# Patient Record
Sex: Female | Born: 1935 | Race: Black or African American | Hispanic: No | Smoking: Never smoker
Health system: Southern US, Community
[De-identification: ages and names within clinical notes are randomized; demographics above are authoritative.]

## PROBLEM LIST (undated history)

## (undated) DIAGNOSIS — M722 Plantar fascial fibromatosis: Secondary | ICD-10-CM

## (undated) DIAGNOSIS — R413 Other amnesia: Secondary | ICD-10-CM

## (undated) DIAGNOSIS — N183 Chronic kidney disease, stage 3 unspecified: Secondary | ICD-10-CM

## (undated) DIAGNOSIS — Z9289 Personal history of other medical treatment: Secondary | ICD-10-CM

## (undated) DIAGNOSIS — I639 Cerebral infarction, unspecified: Secondary | ICD-10-CM

## (undated) DIAGNOSIS — E785 Hyperlipidemia, unspecified: Secondary | ICD-10-CM

## (undated) DIAGNOSIS — I10 Essential (primary) hypertension: Secondary | ICD-10-CM

## (undated) DIAGNOSIS — M1990 Unspecified osteoarthritis, unspecified site: Secondary | ICD-10-CM

## (undated) DIAGNOSIS — F329 Major depressive disorder, single episode, unspecified: Secondary | ICD-10-CM

## (undated) DIAGNOSIS — G459 Transient cerebral ischemic attack, unspecified: Secondary | ICD-10-CM

## (undated) DIAGNOSIS — G709 Myoneural disorder, unspecified: Secondary | ICD-10-CM

## (undated) DIAGNOSIS — R200 Anesthesia of skin: Secondary | ICD-10-CM

## (undated) DIAGNOSIS — I119 Hypertensive heart disease without heart failure: Secondary | ICD-10-CM

## (undated) DIAGNOSIS — K219 Gastro-esophageal reflux disease without esophagitis: Secondary | ICD-10-CM

## (undated) DIAGNOSIS — F32A Depression, unspecified: Secondary | ICD-10-CM

## (undated) DIAGNOSIS — F419 Anxiety disorder, unspecified: Secondary | ICD-10-CM

## (undated) HISTORY — PX: BILATERAL SALPINGOOPHORECTOMY: SHX1223

## (undated) HISTORY — DX: Chronic kidney disease, stage 3 (moderate): N18.3

## (undated) HISTORY — DX: Anesthesia of skin: R20.0

## (undated) HISTORY — DX: Plantar fascial fibromatosis: M72.2

## (undated) HISTORY — PX: TOTAL ABDOMINAL HYSTERECTOMY: SHX209

## (undated) HISTORY — DX: Chronic kidney disease, stage 3 unspecified: N18.30

## (undated) HISTORY — PX: OTHER SURGICAL HISTORY: SHX169

## (undated) HISTORY — DX: Hyperlipidemia, unspecified: E78.5

## (undated) HISTORY — DX: Transient cerebral ischemic attack, unspecified: G45.9

## (undated) HISTORY — DX: Essential (primary) hypertension: I10

---

## 2011-06-02 ENCOUNTER — Ambulatory Visit: Payer: Self-pay | Admitting: Physical Therapy

## 2011-06-03 ENCOUNTER — Ambulatory Visit: Payer: Medicare Other | Attending: Family Medicine | Admitting: Physical Therapy

## 2011-06-03 DIAGNOSIS — M25619 Stiffness of unspecified shoulder, not elsewhere classified: Secondary | ICD-10-CM | POA: Insufficient documentation

## 2011-06-03 DIAGNOSIS — M6281 Muscle weakness (generalized): Secondary | ICD-10-CM | POA: Insufficient documentation

## 2011-06-03 DIAGNOSIS — IMO0001 Reserved for inherently not codable concepts without codable children: Secondary | ICD-10-CM | POA: Insufficient documentation

## 2011-06-03 DIAGNOSIS — M25519 Pain in unspecified shoulder: Secondary | ICD-10-CM | POA: Insufficient documentation

## 2011-06-06 ENCOUNTER — Ambulatory Visit: Payer: Self-pay | Admitting: Physical Therapy

## 2011-06-11 ENCOUNTER — Ambulatory Visit: Payer: Medicare Other | Admitting: Physical Therapy

## 2011-06-18 ENCOUNTER — Ambulatory Visit: Payer: Medicare Other | Attending: Family Medicine

## 2011-06-18 DIAGNOSIS — M25519 Pain in unspecified shoulder: Secondary | ICD-10-CM | POA: Insufficient documentation

## 2011-06-18 DIAGNOSIS — IMO0001 Reserved for inherently not codable concepts without codable children: Secondary | ICD-10-CM | POA: Insufficient documentation

## 2011-06-18 DIAGNOSIS — M6281 Muscle weakness (generalized): Secondary | ICD-10-CM | POA: Insufficient documentation

## 2011-06-18 DIAGNOSIS — M25619 Stiffness of unspecified shoulder, not elsewhere classified: Secondary | ICD-10-CM | POA: Insufficient documentation

## 2011-06-25 ENCOUNTER — Ambulatory Visit: Payer: Medicare Other

## 2013-05-10 ENCOUNTER — Ambulatory Visit: Payer: Medicare Other | Admitting: Neurology

## 2013-06-02 ENCOUNTER — Encounter: Payer: Self-pay | Admitting: *Deleted

## 2013-06-08 ENCOUNTER — Ambulatory Visit (INDEPENDENT_AMBULATORY_CARE_PROVIDER_SITE_OTHER): Payer: Medicare Other | Admitting: Neurology

## 2013-06-08 ENCOUNTER — Encounter: Payer: Self-pay | Admitting: Neurology

## 2013-06-08 VITALS — BP 164/88 | HR 84 | Ht 61.42 in | Wt 142.4 lb

## 2013-06-08 DIAGNOSIS — R209 Unspecified disturbances of skin sensation: Secondary | ICD-10-CM

## 2013-06-08 DIAGNOSIS — R200 Anesthesia of skin: Secondary | ICD-10-CM

## 2013-06-08 DIAGNOSIS — I679 Cerebrovascular disease, unspecified: Secondary | ICD-10-CM

## 2013-06-08 NOTE — Progress Notes (Signed)
NEUROLOGY CONSULTATION NOTE  Christina Austin MRN: [id number] DOB: 23-Dec-1934  Referring provider: Dr. Harrington Challenger Primary care provider: Dr. Sabra Heck  Reason for consult:  Right facial numbness  HISTORY OF PRESENT ILLNESS: Christina Austin is a 79 year old right-handed woman with history of hypertension, cerebrovascular disease and angina who presents for history of TIA and recurrent right-facial numbness.  Outside records from [facility] reviewed.  She's had these episodes for over 10 years. She will describe onset of right facial numbness. Sometimes it is briefly associated with right facial weakness as well. It can occur anywhere from one hour to an entire day. She cannot really tell me how frequent she gets them. However, she tends to have periods when they become more frequent. On rare occasions, it has been associated with headache, described as right-sided, and pressure-like. It was not associated with nausea, photophobia, or phonophobia. She has no prior history is of migraine. She has seen other neurologists in the past and has had an unremarkable workup. Most recently, she had no workup performed at the [facility] clinic.  She was admitted to the [facility] on 11/03/12 for what was diagnosed as a TIA.  She had developed sensation of right sided facial warmth, then right-sided facial droop and right upper extremity weakness, lasting less than 5 minutes.  Work up included: CT Head:  chronic left internal capsule lacunar infarct, but no acute abnormalities. MRI Brain w/o:  small vessel ischemic changes.  No acute intracranial abnormalities. MRA Head & Neck:  No intracranial or extracranial stenosis. 2D Echo:  EF 64 +/- 65%, no source of embolus LABS:  Hgb A1c 6.0, est avg glucose 126, LDL 54, INR 1.0 EEG:  intermittent slowing in the left temporal region, "suggestive of a cortical dysfunction in the left temporal region.  No epileptiform activity or EEG seizures were noted." EKG:  no ST/T  changes. CXR:  Unremarkable.  More recently, they have been occurring almost every day. Lately, she notes some possible weakness or heaviness in the right arm. She notes a soreness in the right side of her neck and into the shoulder. On questioning, she think she may notice occasional numbness in the right thumb.  Current medications:  HCTZ 25mg , amlodipine 10mg , simvastatin 40mg , Plavix 75mg .  PAST MEDICAL HISTORY: Past Medical History  Diagnosis Date  . Plantar fasciitis   . Numbness of face   . Benign essential HTN   . Other and unspecified hyperlipidemia   . Unspecified transient cerebral ischemia   . Chronic kidney disease, stage III (moderate)     PAST SURGICAL HISTORY: Past Surgical History  Procedure Laterality Date  . Total abdominal hysterectomy    . Bilateral salpingoophorectomy      MEDICATIONS: Current Outpatient Prescriptions on File Prior to Visit  Medication Sig Dispense Refill  . amLODipine (NORVASC) 10 MG tablet Take 10 mg by mouth daily.      . clopidogrel (PLAVIX) 75 MG tablet Take 75 mg by mouth daily with breakfast.      . hydrochlorothiazide (HYDRODIURIL) 25 MG tablet Take 25 mg by mouth.      . simvastatin (ZOCOR) 40 MG tablet Take 40 mg by mouth daily.       No current facility-administered medications on file prior to visit.    ALLERGIES: Allergies  Allergen Reactions  . Aspirin     GI Bleed     FAMILY HISTORY: Family History  Problem Relation Age of Onset  . Cancer Mother  panceratic    SOCIAL HISTORY: History   Social History  . Marital Status: Single    Spouse Name: N/A    Number of Children: N/A  . Years of Education: N/A   Occupational History  . Not on file.   Social History Main Topics  . Smoking status: Never Smoker   . Smokeless tobacco: Never Used  . Alcohol Use: No  . Drug Use: No  . Sexual Activity: Not on file   Other Topics Concern  . Not on file   Social History Narrative  . No narrative on file     REVIEW OF SYSTEMS: Constitutional: No fevers, chills, or sweats, no generalized fatigue, change in appetite Eyes: No visual changes, double vision, eye pain Ear, nose and throat: No hearing loss, ear pain, nasal congestion, sore throat Cardiovascular: No chest pain, palpitations Respiratory:  No shortness of breath at rest or with exertion, wheezes GastrointestinaI: No nausea, vomiting, diarrhea, abdominal pain, fecal incontinence Genitourinary:  No dysuria, urinary retention or frequency Musculoskeletal:  No neck pain, back pain Integumentary: No rash, pruritus, skin lesions Neurological: as above Psychiatric: No depression, insomnia, anxiety Endocrine: No palpitations, fatigue, diaphoresis, mood swings, change in appetite, change in weight, increased thirst Hematologic/Lymphatic:  No anemia, purpura, petechiae. Allergic/Immunologic: no itchy/runny eyes, nasal congestion, recent allergic reactions, rashes  PHYSICAL EXAM: Filed Vitals:   06/08/13 1316  BP: 164/88  Pulse: 84   General: No acute distress Head:  Normocephalic/atraumatic Neck: supple, no paraspinal tenderness, full range of motion Back: No paraspinal tenderness Heart: regular rate and rhythm Lungs: Clear to auscultation bilaterally. Vascular: No carotid bruits. Neurological Exam: Mental status: alert and oriented to person, place, and time, recent and remote memory intact, fund of knowledge intact, attention and concentration intact, speech fluent and not dysarthric, language intact. Cranial nerves: CN I: not tested CN II: pupils equal, round and reactive to light, visual fields intact, fundi unremarkable, without vessel changes, exudates, hemorrhages or papilledema. CN III, IV, VI:  full range of motion, no nystagmus, no ptosis CN V: facial sensation intact CN VII: upper and lower face symmetric CN VIII: hearing intact CN IX, X: gag intact, uvula midline CN XI: sternocleidomastoid and trapezius muscles  intact CN XII: tongue midline Bulk & Tone: normal, no fasciculations. Motor: 5 out of 5 throughout Sensation: Pinprick and vibration intact Deep Tendon Reflexes: 2+ throughout, toes downgoing Finger to nose testing: No dysmetria Heel to shin: No dysmetria Gait: Normal station and stride. Able to turn and walk in tandem. Romberg negative.  IMPRESSION: 1.  Recurrent right facial numbness. Differential diagnosis includes simple partial seizures versus atypical migraine. TIA is possible but less likely has these have been recurrent stereotypical spells over many years, which would be unusual for a TIA.  Exam today is normal. 2.  Cerebrovascular disease  PLAN: 1.  Will order 24 hour ambulatory EEG.  She has been getting them daily, so hopefully we can capture a spell.  Also, would like to evaluate for reported left temporal slowing as seen on prior routine EEG, which may be seizure focus, stroke or of undetermined significance in a patient her age. 2.  Options would be to consider starting an antiepileptic medication empirically to see if spells improve. 3.  Continue Plavix and optimize blood pressure control for secondary stroke prevention. 4.  Follow up in 4 weeks.  45 minutes spent with patient, over 50% spent counseling and coordinating care.  Thank you for allowing me to take part in the  care of this patient.  Metta Clines, DO  CC:  C. Melinda Crutch, MD  Kathyrn Lass, MD

## 2013-06-08 NOTE — Patient Instructions (Signed)
I really don't think it is a TIA.  Other possibilities could be seizure or migraine. 1.  We will schedule an ambulatory EEG to try and capture these events. 2.  Consider starting a seizure medication to see if symptoms improve 3.  Follow up in 4 weeks.

## 2013-07-18 ENCOUNTER — Other Ambulatory Visit: Payer: Medicare Other

## 2013-07-22 ENCOUNTER — Ambulatory Visit: Payer: Medicare Other | Admitting: Neurology

## 2013-07-25 ENCOUNTER — Ambulatory Visit: Payer: Medicare Other | Admitting: Neurology

## 2013-08-01 ENCOUNTER — Ambulatory Visit (INDEPENDENT_AMBULATORY_CARE_PROVIDER_SITE_OTHER): Payer: Medicare Other | Admitting: Neurology

## 2013-08-01 DIAGNOSIS — R200 Anesthesia of skin: Secondary | ICD-10-CM

## 2013-08-01 DIAGNOSIS — R209 Unspecified disturbances of skin sensation: Secondary | ICD-10-CM

## 2013-08-03 NOTE — Procedures (Signed)
24 HOUR AMBULATORY ELECTROENCEPHALOGRAM REPORT  Date of Study: 08/01/2013 to 08/02/2013  Patient's Name: Christina Austin MRN: [id number] Date of Birth: 12/08/1934  Referring Provider: Stephan Minister. Tomi Likens, DO  Indication: [name] is a 78 year old right-handed woman with history of hypertension, cerebrovascular disease and angina who presents for history of TIA and recurrent right-facial numbness.  Medications: Plavix, Zocor, HCTZ  Technical Summary: This is a multichannel digital EEG recording, using the international 10-20 placement system.  Spike detection software was employed.  Description: The EEG background is symmetric, with a well-developed posterior dominant rhythm of 10-11 Hz, which is reactive to eye opening and closing.  Diffuse beta activity is seen, with a bilateral frontal preponderance.  No focal or generalized abnormalities are seen.  No focal or generalized epileptiform discharges are seen.  The patient's habitual spells were not captured during this study.  Stage II sleep is seen, with normal and symmetric sleep patterns.  Hyperventilation and photic stimulation were not performed.  ECG revealed normal cardiac rate and rhythm.  Impression: This is a normal 24 hour ambulatory EEG of the awake and asleep states, without activating procedures.  A normal study does not rule out the possibility of a seizure disorder in this patient.  Onnika Siebel R. Tomi Likens, DO

## 2013-08-12 ENCOUNTER — Ambulatory Visit (INDEPENDENT_AMBULATORY_CARE_PROVIDER_SITE_OTHER): Payer: Medicare Other | Admitting: Neurology

## 2013-08-12 ENCOUNTER — Encounter: Payer: Self-pay | Admitting: Neurology

## 2013-08-12 VITALS — BP 122/70 | HR 82 | Temp 97.8°F | Resp 16 | Wt 147.0 lb

## 2013-08-12 DIAGNOSIS — M6281 Muscle weakness (generalized): Secondary | ICD-10-CM

## 2013-08-12 DIAGNOSIS — R200 Anesthesia of skin: Secondary | ICD-10-CM

## 2013-08-12 DIAGNOSIS — R209 Unspecified disturbances of skin sensation: Secondary | ICD-10-CM

## 2013-08-12 DIAGNOSIS — R531 Weakness: Secondary | ICD-10-CM

## 2013-08-12 NOTE — Patient Instructions (Signed)
Possible causes may be migraine or simple partial seizures, although they are both low suspicion.  I don't think they are strokes however.  We can always try a seizure medication to see if they resolve.  Call with any questions or concerns.

## 2013-08-12 NOTE — Progress Notes (Addendum)
NEUROLOGY FOLLOW UP OFFICE NOTE  Christina Austin [id number]  HISTORY OF PRESENT ILLNESS: Christina Austin is a 79 year old right-handed woman with history of hypertension, cerebrovascular disease and angina who follows up for recurrent right facial numbness.    UPDATE: 24 hour ambulatory EEG was performed 08/01/13-08/02/13, which was normal.  She reports having had episodes of right facial numbness and right arm and leg weakness with mild pain during the study.  No facial droop, however.  HISTORY: She's had these episodes for over 10 years. She will describe onset of right facial numbness. Sometimes it is briefly associated with right facial weakness as well. It can occur anywhere from one hour to an entire day. She cannot really tell me how frequent she gets them. However, she tends to have periods when they become more frequent. On rare occasions, it has been associated with headache, described as right-sided, and pressure-like. It was not associated with nausea, photophobia, or phonophobia. She has no prior history is of migraine. She has seen other neurologists in the past and has had an unremarkable workup. Most recently, she had no workup performed at the [facility] clinic.  She was admitted to the [facility] on 11/03/12 for what was diagnosed as a TIA.  She had developed sensation of right sided facial warmth, then right-sided facial droop and right upper extremity weakness, lasting less than 5 minutes.  Work up included: CT Head:  chronic left internal capsule lacunar infarct, but no acute abnormalities. MRI Brain w/o:  small vessel ischemic changes.  No acute intracranial abnormalities. MRA Head & Neck:  No intracranial or extracranial stenosis. 2D Echo:  EF 64 +/- 65%, no source of embolus LABS:  Hgb A1c 6.0, est avg glucose 126, LDL 54, INR 1.0 EEG:  intermittent slowing in the left temporal region, "suggestive of a cortical dysfunction in the left temporal region.  No epileptiform  activity or EEG seizures were noted." EKG:  no ST/T changes. CXR:  Unremarkable.  More recently, they have been occurring almost every day. Lately, she notes some possible weakness or heaviness in the right arm. She notes a soreness in the right side of her neck and into the shoulder. On questioning, she think she may notice occasional numbness in the right thumb.  Current medications:  HCTZ 25mg , amlodipine 10mg , simvastatin 40mg , Plavix 75mg .  PAST MEDICAL HISTORY: Past Medical History  Diagnosis Date  . Plantar fasciitis   . Numbness of face   . Benign essential HTN   . Other and unspecified hyperlipidemia   . Unspecified transient cerebral ischemia   . Chronic kidney disease, stage III (moderate)     MEDICATIONS: Current Outpatient Prescriptions on File Prior to Visit  Medication Sig Dispense Refill  . amLODipine (NORVASC) 10 MG tablet Take 10 mg by mouth daily.      . clopidogrel (PLAVIX) 75 MG tablet Take 75 mg by mouth daily with breakfast.      . hydrochlorothiazide (HYDRODIURIL) 25 MG tablet Take 25 mg by mouth.      . simvastatin (ZOCOR) 40 MG tablet Take 40 mg by mouth daily.       No current facility-administered medications on file prior to visit.    ALLERGIES: Allergies  Allergen Reactions  . Aspirin     GI Bleed     FAMILY HISTORY: Family History  Problem Relation Age of Onset  . Cancer Mother     panceratic    SOCIAL HISTORY: History   Social History  .  Marital Status: Single    Spouse Name: N/A    Number of Children: N/A  . Years of Education: N/A   Occupational History  . Not on file.   Social History Main Topics  . Smoking status: Never Smoker   . Smokeless tobacco: Never Used  . Alcohol Use: No  . Drug Use: No  . Sexual Activity: Not on file   Other Topics Concern  . Not on file   Social History Narrative  . No narrative on file    REVIEW OF SYSTEMS: Constitutional: No fevers, chills, or sweats, no generalized fatigue, change  in appetite Eyes: No visual changes, double vision, eye pain Ear, nose and throat: No hearing loss, ear pain, nasal congestion, sore throat Cardiovascular: No chest pain, palpitations Respiratory:  No shortness of breath at rest or with exertion, wheezes GastrointestinaI: No nausea, vomiting, diarrhea, abdominal pain, fecal incontinence Genitourinary:  No dysuria, urinary retention or frequency Musculoskeletal:  No neck pain, back pain Integumentary: No rash, pruritus, skin lesions Neurological: as above Psychiatric: No depression, insomnia, anxiety Endocrine: No palpitations, fatigue, diaphoresis, mood swings, change in appetite, change in weight, increased thirst Hematologic/Lymphatic:  No anemia, purpura, petechiae. Allergic/Immunologic: no itchy/runny eyes, nasal congestion, recent allergic reactions, rashes  PHYSICAL EXAM: Filed Vitals:   08/12/13 1000  BP: 122/70  Pulse: 82  Temp: 97.8 F (36.6 C)  Resp: 16   General: No acute distress Head:  Normocephalic/atraumatic Neck: supple, no paraspinal tenderness, full range of motion Heart:  Regular rate and rhythm Lungs:  Clear to auscultation bilaterally Back: No paraspinal tenderness Neurological Exam: alert and oriented to person, place, and time. Attention span and concentration intact, recent and remote memory intact, fund of knowledge intact.  Speech fluent and not dysarthric, language intact.  CN II-XII intact. Fundoscopic exam unremarkable without vessel changes, exudates, hemorrhages or papilledema.  Bulk and tone normal, muscle strength 5/5 throughout.  Sensation to light touch, temperature and vibration intact.  Deep tendon reflexes 2+ throughout, toes downgoing.  Finger to nose and heel to shin testing intact.  Gait normal.  IMPRESSION: 1.  Recurrent transient right sided weakness and facial numbness.  Etiology unclear.  Possible diagnoses include migraine, although she has no history of migraine and these spells are not  accompanied by headache. Other possibilities includes simple partial seizure, although the EEG was negative (but simple partial seizures may not always be evident on EEG). I don't believe that these aren't transient ischemic attacks, as that would be unusual for a TIA to present as recurrent habitual events, especially over the course of several years. 2.  Cerebrovascular disease PLAN: 1.  All I can suggest is to possibly try an antiepileptic medication and see if the spells resolved. At this time, she does not wish to start any new medications. She may followup as needed.  2.  Continue secondary stroke prevention:  Plavix, simvastatin (LDL at goal), blood pressure control 15 minutes that with the patient, over 50% spent discussing possible diagnoses and treatment options.  Metta Clines, DO  CC:  Kathyrn Lass, MD  C. Melinda Crutch, MD

## 2015-04-02 ENCOUNTER — Other Ambulatory Visit: Payer: Self-pay

## 2015-04-02 DIAGNOSIS — Z1231 Encounter for screening mammogram for malignant neoplasm of breast: Secondary | ICD-10-CM

## 2015-04-16 ENCOUNTER — Ambulatory Visit
Admission: RE | Admit: 2015-04-16 | Discharge: 2015-04-16 | Disposition: A | Discharge status: Home / Self Care | Payer: Medicare Other | Source: Ambulatory Visit

## 2015-04-16 DIAGNOSIS — Z1231 Encounter for screening mammogram for malignant neoplasm of breast: Secondary | ICD-10-CM

## 2015-04-18 ENCOUNTER — Other Ambulatory Visit: Payer: Self-pay | Admitting: Family Medicine

## 2015-04-18 DIAGNOSIS — R928 Other abnormal and inconclusive findings on diagnostic imaging of breast: Secondary | ICD-10-CM

## 2015-05-03 ENCOUNTER — Other Ambulatory Visit: Payer: Self-pay | Admitting: Family Medicine

## 2015-05-03 ENCOUNTER — Ambulatory Visit
Admission: RE | Admit: 2015-05-03 | Discharge: 2015-05-03 | Disposition: A | Discharge status: Home / Self Care | Payer: Medicare Other | Source: Ambulatory Visit | Attending: Family Medicine | Admitting: Family Medicine

## 2015-05-03 DIAGNOSIS — R928 Other abnormal and inconclusive findings on diagnostic imaging of breast: Secondary | ICD-10-CM

## 2015-05-03 DIAGNOSIS — N6310 Unspecified lump in the right breast, unspecified quadrant: Secondary | ICD-10-CM

## 2015-05-08 ENCOUNTER — Other Ambulatory Visit: Payer: Medicare Other

## 2015-05-10 ENCOUNTER — Ambulatory Visit
Admission: RE | Admit: 2015-05-10 | Discharge: 2015-05-10 | Disposition: A | Discharge status: Home / Self Care | Payer: Medicare Other | Source: Ambulatory Visit | Attending: Family Medicine | Admitting: Family Medicine

## 2015-05-10 ENCOUNTER — Other Ambulatory Visit: Payer: Self-pay | Admitting: Family Medicine

## 2015-05-10 DIAGNOSIS — N6310 Unspecified lump in the right breast, unspecified quadrant: Secondary | ICD-10-CM

## 2015-05-22 ENCOUNTER — Ambulatory Visit: Payer: Self-pay | Admitting: General Surgery

## 2015-05-22 DIAGNOSIS — D241 Benign neoplasm of right breast: Secondary | ICD-10-CM

## 2015-05-22 NOTE — H&P (Signed)
Christina Austin. [place] 05/22/2015 4:23 PM Location: [facility] Patient #: [id number] DOB: November 29, 1934 Widowed / Language: Christina Austin / Race: Black or African American Female  History of Present Illness [name] MD; 05/22/2015 5:34 PM) The patient is a 81 year old female.   Note:She is referred by Dr. Radford Pax for consultation regarding a right breast sclerosing papilloma. She was noted to have an irregular mass within the retroareolar right breast position measuring approximately 2 cm and associated with pleomorphic calcifications. This is at the 12 o'clock position. There is a small mass adjacent to this measuring 8 mm. Image guided biopsy demonstrated the above pathology and wider excision was recommended. She had some clear nipple discharge in the past while she was taking hormone replacement therapy, but when she stopped so did the nipple discharge. No family history of breast or ovarian cancer. She took hormone replacement therapy for about 25-30 years. Prior to this finding, she denied any breast masses or pain in the breast.  Allergies Elbert Ewings, CMA; 05/22/2015 4:24 PM) Aspirin *ANALGESICS - NonNarcotic*  Medication History Elbert Ewings, CMA; 05/22/2015 4:24 PM) BuPROPion HCl ER (XL) (150MG  Tablet ER 24HR, Oral) Active. AmLODIPine Besylate (10MG  Tablet, Oral) Active. Omeprazole (20MG  Capsule DR, Oral) Active. Simvastatin (40MG  Tablet, Oral) Active. Hydrochlorothiazide (25MG  Tablet, Oral) Active. Clopidogrel Bisulfate (75MG  Tablet, Oral) Active. Medications Reconciled    Vitals Elbert Ewings CMA; 05/22/2015 4:24 PM) 05/22/2015 4:24 PM Weight: 138 lb Height: 62in Body Surface Area: 1.63 m Body Mass Index: 25.24 kg/m  Temp.: 98.69F  Pulse: 66 (Regular)       Physical Exam [name] MD; 05/22/2015 5:36 PM)  The physical exam findings are as follows: Note:General: WDWN in NAD. Pleasant and cooperative. Appears younger than stated  age  66: [place]/AT, no facial masses  EYES: EOMI, no icterus  NECK: Supple, no obvious mass .  CV: RRR, no murmur, no JVD.  CHEST: Breath sounds equal and clear. Respirations nonlabored.  BREASTS: Symmetrical in size. No dominant masses, nipple discharge or suspicious skin lesions. Some bruising and needle mark in right breast.  ABDOMEN: Soft, nontender, nondistended, midline scar present.  MUSCULOSKELETAL: FROM, good muscle tone, no edema, no venous stasis changes  LYMPHATIC: No palpable cervical, supraclavicular, axillary adenopathy.  NEUROLOGIC: Alert and oriented, answers questions appropriately, normal gait and station.  PSYCHIATRIC: Normal mood, affect , and behavior.    Assessment & Plan [name] MD; 05/22/2015 5:36 PM)  PAPILLOMA OF RIGHT BREAST (D24.1) Impression: This has sclerosing features which has a higher risk of having a surrounding neoplastic process and we discussed this. Recommendation is for wider excision and she is in agreement with this.  Plan: Right breast lumpectomy after radioactive seed localization. I have explained the procedure, risks, and aftercare to her. Risks include but are not limited to bleeding, infection, wound problems, seroma formation, cosmetic deformity, anesthesia. She seems to [facility] and agrees with the plan. I asked her to stop the Plavix 5 days before surgery.  [name], MD

## 2015-06-07 ENCOUNTER — Other Ambulatory Visit: Payer: Self-pay | Admitting: Gastroenterology

## 2015-07-03 ENCOUNTER — Other Ambulatory Visit: Payer: Self-pay | Admitting: General Surgery

## 2015-07-03 DIAGNOSIS — D241 Benign neoplasm of right breast: Secondary | ICD-10-CM

## 2015-07-24 NOTE — Pre-Procedure Instructions (Addendum)
Tenille [facility]ment[facility]  07/24/2015      [id number] - [place], [place] - [address] AT [address] [place] [place] [address] Phone: [phone] Fax: [phone]    Your procedure is scheduled on : Thursday, July 20.  Report to [facility] Admitting at 5:30  A.M.              Your surgery or procedure is scheduled for 7:30 AM   Call this number if you have problems the morning of surgery:[phone]               For any other questions, please call [phone], Monday - Friday 8 AM - 4 PM.   Remember:  Do not eat food or drink liquids after midnight Wednesday, July 19.  Take these medicines the morning of surgery with A SIP OF WATER:   BUPROPION              Hold Plavix as instructed by Dr Zella Richer.   Do not wear jewelry, make-up or nail polish.  Do not wear lotions, powders, or perfumes.    Do not shave 48 hours prior to surgery.    Do not bring valuables to the hospital.  [facility] is not responsible for any belongings or valuables.  Contacts, dentures or bridgework may not be worn into surgery.  Leave your suitcase in the car.  After surgery it may be brought to your room.  For patients admitted to the hospital, discharge time will be determined by your treatment team.  Patients discharged the day of surgery will not be allowed to drive home.   Name and phone number of your driver: -family  Special instructions:  Special Instructions: [place] - Preparing for Surgery  Before surgery, you can play an important role.  Because skin is not sterile, your skin needs to be as free of germs as possible.  You can reduce the number of germs on you skin by washing with CHG (chlorahexidine gluconate) soap before surgery.  CHG is an antiseptic cleaner which kills germs and bonds with the skin to continue killing germs even after washing.  Please DO NOT use if you have an allergy to CHG or antibacterial soaps.  If your  skin becomes reddened/irritated stop using the CHG and inform your nurse when you arrive at Short Stay.  Do not shave (including legs and underarms) for at least 48 hours prior to the first CHG shower.  You may shave your face.  Please follow these instructions carefully:   1.  Shower with CHG Soap the night before surgery and the  morning of Surgery.  2.  If you choose to wash your hair, wash your hair first as usual with your  normal shampoo.  3.  After you shampoo, rinse your hair and body thoroughly to remove the  Shampoo.  4.  Use CHG as you would any other liquid soap.  You can apply chg directly to the skin and wash gently with scrungie or a clean washcloth.  5.  Apply the CHG Soap to your body ONLY FROM THE NECK DOWN.    Do not use on open wounds or open sores.  Avoid contact with your eyes, ears, mouth and genitals (private parts).  Wash genitals (private parts)   with your normal soap.  6.  Wash thoroughly, paying special attention to the area where your surgery will be performed.  7.  Thoroughly rinse your  body with warm water from the neck down.  8.  DO NOT shower/wash with your normal soap after using and rinsing off   the CHG Soap.  9.  Pat yourself dry with a clean towel.            10.  Wear clean pajamas.            11.  Place clean sheets on your bed the night of your first shower and do not sleep with pets.  Day of Surgery  Do not apply any lotions/deodorants the morning of surgery.  Please wear clean clothes to the hospital/surgery center.  Please read over the following fact sheets that you were given. -

## 2015-07-25 ENCOUNTER — Encounter (HOSPITAL_COMMUNITY): Payer: Self-pay

## 2015-07-25 ENCOUNTER — Encounter (HOSPITAL_COMMUNITY)
Admission: RE | Admit: 2015-07-25 | Discharge: 2015-07-25 | Disposition: A | Discharge status: Home / Self Care | Payer: Medicare Other | Source: Ambulatory Visit | Attending: General Surgery | Admitting: General Surgery

## 2015-07-25 DIAGNOSIS — R9431 Abnormal electrocardiogram [ECG] [EKG]: Secondary | ICD-10-CM | POA: Diagnosis not present

## 2015-07-25 DIAGNOSIS — D241 Benign neoplasm of right breast: Secondary | ICD-10-CM | POA: Diagnosis not present

## 2015-07-25 DIAGNOSIS — I10 Essential (primary) hypertension: Secondary | ICD-10-CM | POA: Insufficient documentation

## 2015-07-25 DIAGNOSIS — Z01810 Encounter for preprocedural cardiovascular examination: Secondary | ICD-10-CM | POA: Diagnosis present

## 2015-07-25 DIAGNOSIS — Z01812 Encounter for preprocedural laboratory examination: Secondary | ICD-10-CM | POA: Diagnosis present

## 2015-07-25 HISTORY — DX: Major depressive disorder, single episode, unspecified: F32.9

## 2015-07-25 HISTORY — DX: Unspecified osteoarthritis, unspecified site: M19.90

## 2015-07-25 HISTORY — DX: Personal history of other medical treatment: Z92.89

## 2015-07-25 HISTORY — DX: Myoneural disorder, unspecified: G70.9

## 2015-07-25 HISTORY — DX: Gastro-esophageal reflux disease without esophagitis: K21.9

## 2015-07-25 HISTORY — DX: Anxiety disorder, unspecified: F41.9

## 2015-07-25 HISTORY — DX: Depression, unspecified: F32.A

## 2015-07-25 LAB — CBC WITH DIFFERENTIAL/PLATELET
Basophils Absolute: 0 10*3/uL (ref 0.0–0.1)
Basophils Relative: 0 %
Eosinophils Absolute: 0.1 10*3/uL (ref 0.0–0.7)
Eosinophils Relative: 2 %
HEMATOCRIT: 41.7 % (ref 36.0–46.0)
HEMOGLOBIN: 13.1 g/dL (ref 12.0–15.0)
LYMPHS PCT: 24 %
Lymphs Abs: 1.1 10*3/uL (ref 0.7–4.0)
MCH: 28.4 pg (ref 26.0–34.0)
MCHC: 31.4 g/dL (ref 30.0–36.0)
MCV: 90.3 fL (ref 78.0–100.0)
Monocytes Absolute: 0.3 10*3/uL (ref 0.1–1.0)
Monocytes Relative: 6 %
NEUTROS ABS: 3.1 10*3/uL (ref 1.7–7.7)
NEUTROS PCT: 68 %
Platelets: 245 10*3/uL (ref 150–400)
RBC: 4.62 MIL/uL (ref 3.87–5.11)
RDW: 13 % (ref 11.5–15.5)
WBC: 4.5 10*3/uL (ref 4.0–10.5)

## 2015-07-25 LAB — COMPREHENSIVE METABOLIC PANEL
ALBUMIN: 3.8 g/dL (ref 3.5–5.0)
ALK PHOS: 103 U/L (ref 38–126)
ALT: 22 U/L (ref 14–54)
ANION GAP: 6 (ref 5–15)
AST: 28 U/L (ref 15–41)
BILIRUBIN TOTAL: 0.7 mg/dL (ref 0.3–1.2)
BUN: 18 mg/dL (ref 6–20)
CALCIUM: 9.7 mg/dL (ref 8.9–10.3)
CO2: 30 mmol/L (ref 22–32)
Chloride: 106 mmol/L (ref 101–111)
Creatinine, Ser: 1.22 mg/dL — ABNORMAL HIGH (ref 0.44–1.00)
GFR calc Af Amer: 48 mL/min — ABNORMAL LOW (ref >60)
GFR, EST NON AFRICAN AMERICAN: 41 mL/min — AB (ref >60)
GLUCOSE: 123 mg/dL — AB (ref 65–99)
POTASSIUM: 3.3 mmol/L — AB (ref 3.5–5.1)
Sodium: 142 mmol/L (ref 135–145)
TOTAL PROTEIN: 7.5 g/dL (ref 6.5–8.1)

## 2015-07-25 NOTE — Progress Notes (Signed)
Pt. Unsure upon first entering the PAT visit whether the surgery was on her L or R breast. Throughout the remaining visit pt. was slow to answer several ?'s, agreed that she forgets at times more recent events & long term events. Determined from the visit that she lived in [place] previously ,was started on Plavix   for angina while living in [place], states she was followed by a cardiac doctor, but doesn';t know the name or the facility where she had care. Pt. Reports that the PCP here locally has "given me some referrals, but I haven't gone or made an appt. For all". Did follow up with GI doctor- colonoscopy was done & has had some polyps removed recently. Pt. Was seen by Neuro for some facial weakness & she has had that on & off for many yrs. Pt. States she had ECHO about 5 yrs. Ago, unsure if it was just for angina, but doesn't know where it was done.

## 2015-07-26 ENCOUNTER — Encounter (HOSPITAL_COMMUNITY): Payer: Self-pay

## 2015-08-01 ENCOUNTER — Ambulatory Visit
Admission: RE | Admit: 2015-08-01 | Discharge: 2015-08-01 | Disposition: A | Discharge status: Home / Self Care | Payer: Medicare Other | Source: Ambulatory Visit | Attending: General Surgery | Admitting: General Surgery

## 2015-08-01 DIAGNOSIS — D241 Benign neoplasm of right breast: Secondary | ICD-10-CM

## 2015-08-02 ENCOUNTER — Ambulatory Visit (HOSPITAL_COMMUNITY): Payer: Medicare Other | Admitting: Emergency Medicine

## 2015-08-02 ENCOUNTER — Ambulatory Visit
Admission: RE | Admit: 2015-08-02 | Discharge: 2015-08-02 | Disposition: A | Discharge status: Home / Self Care | Payer: Medicare Other | Source: Ambulatory Visit | Attending: General Surgery | Admitting: General Surgery

## 2015-08-02 ENCOUNTER — Encounter (HOSPITAL_COMMUNITY): Payer: Self-pay | Admitting: *Deleted

## 2015-08-02 ENCOUNTER — Ambulatory Visit (HOSPITAL_COMMUNITY)
Admission: RE | Admit: 2015-08-02 | Discharge: 2015-08-02 | Disposition: A | Discharge status: Home / Self Care | Payer: Medicare Other | Source: Ambulatory Visit | Attending: General Surgery | Admitting: General Surgery

## 2015-08-02 ENCOUNTER — Encounter (HOSPITAL_COMMUNITY)
Admission: RE | Disposition: A | Discharge status: Home / Self Care | Payer: Self-pay | Source: Ambulatory Visit | Attending: General Surgery

## 2015-08-02 ENCOUNTER — Ambulatory Visit (HOSPITAL_COMMUNITY): Payer: Medicare Other | Admitting: Certified Registered Nurse Anesthetist

## 2015-08-02 DIAGNOSIS — D241 Benign neoplasm of right breast: Secondary | ICD-10-CM | POA: Diagnosis present

## 2015-08-02 DIAGNOSIS — F329 Major depressive disorder, single episode, unspecified: Secondary | ICD-10-CM | POA: Insufficient documentation

## 2015-08-02 DIAGNOSIS — F419 Anxiety disorder, unspecified: Secondary | ICD-10-CM | POA: Insufficient documentation

## 2015-08-02 DIAGNOSIS — I10 Essential (primary) hypertension: Secondary | ICD-10-CM | POA: Insufficient documentation

## 2015-08-02 HISTORY — PX: BREAST LUMPECTOMY WITH RADIOACTIVE SEED LOCALIZATION: SHX6424

## 2015-08-02 SURGERY — BREAST LUMPECTOMY WITH RADIOACTIVE SEED LOCALIZATION
Anesthesia: General | Site: Breast | Laterality: Right

## 2015-08-02 MED ORDER — ONDANSETRON HCL 4 MG/2ML IJ SOLN
INTRAMUSCULAR | Status: AC
  Filled 2015-08-02: qty 2

## 2015-08-02 MED ORDER — BUPIVACAINE-EPINEPHRINE (PF) 0.25% -1:200000 IJ SOLN
INTRAMUSCULAR | Status: AC
  Filled 2015-08-02: qty 30

## 2015-08-02 MED ORDER — HYDROCODONE-ACETAMINOPHEN 5-325 MG PO TABS: 1.0000 | ORAL_TABLET | ORAL | Status: DC | PRN

## 2015-08-02 MED ORDER — CEFAZOLIN SODIUM-DEXTROSE 2-4 GM/100ML-% IV SOLN
INTRAVENOUS | Status: AC
  Administered 2015-08-02: 2 g via INTRAVENOUS
  Filled 2015-08-02: qty 100

## 2015-08-02 MED ORDER — ONDANSETRON HCL 4 MG/2ML IJ SOLN
4.0000 mg | Freq: Four times a day (QID) | INTRAMUSCULAR | Status: AC | PRN
  Administered 2015-08-02: 4 mg via INTRAVENOUS

## 2015-08-02 MED ORDER — BUPIVACAINE-EPINEPHRINE 0.25% -1:200000 IJ SOLN
INTRAMUSCULAR | Status: DC | PRN
  Administered 2015-08-02: 12 mL

## 2015-08-02 MED ORDER — PHENYLEPHRINE HCL 10 MG/ML IJ SOLN
INTRAMUSCULAR | Status: DC | PRN
  Administered 2015-08-02 (×2): 40 ug via INTRAVENOUS
  Administered 2015-08-02: 80 ug via INTRAVENOUS

## 2015-08-02 MED ORDER — PROPOFOL 10 MG/ML IV BOLUS
INTRAVENOUS | Status: AC
  Filled 2015-08-02: qty 40

## 2015-08-02 MED ORDER — OXYCODONE HCL 5 MG/5ML PO SOLN: 5.0000 mg | Freq: Once | ORAL | Status: DC | PRN

## 2015-08-02 MED ORDER — ACETAMINOPHEN 650 MG RE SUPP: 650.0000 mg | RECTAL | Status: DC | PRN

## 2015-08-02 MED ORDER — FENTANYL CITRATE (PF) 250 MCG/5ML IJ SOLN
INTRAMUSCULAR | Status: AC
  Filled 2015-08-02: qty 5

## 2015-08-02 MED ORDER — EPHEDRINE SULFATE 50 MG/ML IJ SOLN
INTRAMUSCULAR | Status: DC | PRN
  Administered 2015-08-02: 5 mg via INTRAVENOUS
  Administered 2015-08-02: 10 mg via INTRAVENOUS
  Administered 2015-08-02: 5 mg via INTRAVENOUS
  Administered 2015-08-02: 10 mg via INTRAVENOUS
  Administered 2015-08-02: 5 mg via INTRAVENOUS
  Administered 2015-08-02: 10 mg via INTRAVENOUS

## 2015-08-02 MED ORDER — DEXAMETHASONE SODIUM PHOSPHATE 10 MG/ML IJ SOLN
INTRAMUSCULAR | Status: DC | PRN
  Administered 2015-08-02: 5 mg via INTRAVENOUS

## 2015-08-02 MED ORDER — FENTANYL CITRATE (PF) 100 MCG/2ML IJ SOLN
INTRAMUSCULAR | Status: DC | PRN
  Administered 2015-08-02 (×2): 25 ug via INTRAVENOUS

## 2015-08-02 MED ORDER — OXYCODONE HCL 5 MG PO TABS: 5.0000 mg | ORAL_TABLET | Freq: Once | ORAL | Status: DC | PRN

## 2015-08-02 MED ORDER — LACTATED RINGERS IV SOLN
INTRAVENOUS | Status: DC | PRN
  Administered 2015-08-02: 07:00:00 via INTRAVENOUS

## 2015-08-02 MED ORDER — CEFAZOLIN SODIUM-DEXTROSE 2-4 GM/100ML-% IV SOLN: 2.0000 g | INTRAVENOUS | Status: DC

## 2015-08-02 MED ORDER — PROPOFOL 10 MG/ML IV BOLUS
INTRAVENOUS | Status: DC | PRN
  Administered 2015-08-02: 50 mg via INTRAVENOUS
  Administered 2015-08-02: 150 mg via INTRAVENOUS

## 2015-08-02 MED ORDER — ACETAMINOPHEN 325 MG PO TABS
650.0000 mg | ORAL_TABLET | ORAL | Status: DC | PRN
  Administered 2015-08-02: 650 mg via ORAL

## 2015-08-02 MED ORDER — ONDANSETRON HCL 4 MG/2ML IJ SOLN
INTRAMUSCULAR | Status: DC | PRN
  Administered 2015-08-02: 4 mg via INTRAVENOUS

## 2015-08-02 MED ORDER — LIDOCAINE HCL (CARDIAC) 20 MG/ML IV SOLN
INTRAVENOUS | Status: DC | PRN
  Administered 2015-08-02: 50 mg via INTRAVENOUS

## 2015-08-02 MED ORDER — ACETAMINOPHEN 325 MG PO TABS
ORAL_TABLET | ORAL | Status: AC
  Filled 2015-08-02: qty 2

## 2015-08-02 MED ORDER — FENTANYL CITRATE (PF) 100 MCG/2ML IJ SOLN: 25.0000 ug | INTRAMUSCULAR | Status: DC | PRN

## 2015-08-02 SURGICAL SUPPLY — 43 items
APPLIER CLIP 9.375 MED OPEN (MISCELLANEOUS)
BENZOIN TINCTURE PRP APPL 2/3 (GAUZE/BANDAGES/DRESSINGS) ×2 IMPLANT
BINDER BREAST LRG (GAUZE/BANDAGES/DRESSINGS) ×2 IMPLANT
BINDER BREAST XLRG (GAUZE/BANDAGES/DRESSINGS) IMPLANT
BLADE SURG 15 STRL LF DISP TIS (BLADE) ×1 IMPLANT
BLADE SURG 15 STRL SS (BLADE) ×1
CANISTER SUCTION 2500CC (MISCELLANEOUS) ×2 IMPLANT
CHLORAPREP W/TINT 26ML (MISCELLANEOUS) ×2 IMPLANT
CLIP APPLIE 9.375 MED OPEN (MISCELLANEOUS) IMPLANT
CLSR STERI-STRIP ANTIMIC 1/2X4 (GAUZE/BANDAGES/DRESSINGS) ×2 IMPLANT
COVER PROBE W GEL 5X96 (DRAPES) ×2 IMPLANT
COVER SURGICAL LIGHT HANDLE (MISCELLANEOUS) ×2 IMPLANT
DEVICE DUBIN SPECIMEN MAMMOGRA (MISCELLANEOUS) ×2 IMPLANT
DRAPE CHEST BREAST 15X10 FENES (DRAPES) ×2 IMPLANT
DRAPE UTILITY XL STRL (DRAPES) ×2 IMPLANT
ELECT CAUTERY BLADE 6.4 (BLADE) ×2 IMPLANT
ELECT REM PT RETURN 9FT ADLT (ELECTROSURGICAL) ×2
ELECTRODE REM PT RTRN 9FT ADLT (ELECTROSURGICAL) ×1 IMPLANT
GLOVE BIOGEL PI IND STRL 8 (GLOVE) ×1 IMPLANT
GLOVE BIOGEL PI INDICATOR 8 (GLOVE) ×1
GLOVE ECLIPSE 8.0 STRL XLNG CF (GLOVE) ×2 IMPLANT
GOWN STRL REUS W/ TWL LRG LVL3 (GOWN DISPOSABLE) ×2 IMPLANT
GOWN STRL REUS W/TWL LRG LVL3 (GOWN DISPOSABLE) ×2
KIT BASIN OR (CUSTOM PROCEDURE TRAY) ×2 IMPLANT
KIT MARKER MARGIN INK (KITS) ×2 IMPLANT
NEEDLE HYPO 25X1 1.5 SAFETY (NEEDLE) ×2 IMPLANT
NS IRRIG 1000ML POUR BTL (IV SOLUTION) ×2 IMPLANT
PACK SURGICAL SETUP 50X90 (CUSTOM PROCEDURE TRAY) ×2 IMPLANT
PENCIL BUTTON HOLSTER BLD 10FT (ELECTRODE) ×2 IMPLANT
SPONGE GAUZE 4X4 12PLY STER LF (GAUZE/BANDAGES/DRESSINGS) ×2 IMPLANT
SPONGE LAP 18X18 X RAY DECT (DISPOSABLE) ×2 IMPLANT
SUT MNCRL AB 4-0 PS2 18 (SUTURE) ×2 IMPLANT
SUT SILK 2 0 SH (SUTURE) IMPLANT
SUT VIC AB 2-0 SH 27 (SUTURE) ×1
SUT VIC AB 2-0 SH 27XBRD (SUTURE) ×1 IMPLANT
SUT VIC AB 3-0 SH 27 (SUTURE) ×1
SUT VIC AB 3-0 SH 27X BRD (SUTURE) ×1 IMPLANT
SYR BULB 3OZ (MISCELLANEOUS) ×2 IMPLANT
SYR CONTROL 10ML LL (SYRINGE) ×2 IMPLANT
TOWEL OR 17X24 6PK STRL BLUE (TOWEL DISPOSABLE) ×2 IMPLANT
TOWEL OR 17X26 10 PK STRL BLUE (TOWEL DISPOSABLE) ×2 IMPLANT
TUBE CONNECTING 12X1/4 (SUCTIONS) ×2 IMPLANT
YANKAUER SUCT BULB TIP NO VENT (SUCTIONS) ×2 IMPLANT

## 2015-08-02 NOTE — Anesthesia Preprocedure Evaluation (Signed)
Anesthesia Evaluation  Patient identified by MRN, date of birth, ID band Patient awake    Reviewed: Allergy & Precautions, H&P , NPO status , Patient's Chart, lab work & pertinent test results  Airway Mallampati: II   Neck ROM: full    Dental   Pulmonary neg pulmonary ROS,    breath sounds clear to auscultation       Cardiovascular hypertension,  Rhythm:regular Rate:Normal     Neuro/Psych Anxiety Depression  Neuromuscular disease    GI/Hepatic GERD  ,  Endo/Other    Renal/GU Renal InsufficiencyRenal disease     Musculoskeletal  (+) Arthritis ,   Abdominal   Peds  Hematology   Anesthesia Other Findings   Reproductive/Obstetrics                             Anesthesia Physical Anesthesia Plan  ASA: III  Anesthesia Plan: General   Post-op Pain Management:    Induction: Intravenous  Airway Management Planned: LMA  Additional Equipment:   Intra-op Plan:   Post-operative Plan:   Informed Consent: I have reviewed the patients History and Physical, chart, labs and discussed the procedure including the risks, benefits and alternatives for the proposed anesthesia with the patient or authorized representative who has indicated his/her understanding and acceptance.     Plan Discussed with: CRNA, Anesthesiologist and Surgeon  Anesthesia Plan Comments:         Anesthesia Quick Evaluation

## 2015-08-02 NOTE — Discharge Instructions (Signed)
[facility] Office Phone Number [phone]  BREAST BIOPSY/ PARTIAL MASTECTOMY: POST OP INSTRUCTIONS  Always review your discharge instruction sheet given to you by the facility where your surgery was performed.  IF YOU HAVE DISABILITY OR FAMILY LEAVE FORMS, YOU MUST BRING THEM TO THE OFFICE FOR PROCESSING.  DO NOT GIVE THEM TO YOUR DOCTOR.  1. A prescription for pain medication may be given to you upon discharge.  Take your pain medication as prescribed, if needed.  If narcotic pain medicine is not needed, then you may take acetaminophen (Tylenol) or ibuprofen (Advil) as needed. 2. Take your usually prescribed medications unless otherwise directed 3. If you need a refill on your pain medication, please contact your pharmacy.  They will contact our office to request authorization.  Prescriptions will not be filled after 5pm or on week-ends. 4. You should eat very light the first 24 hours after surgery, such as soup, crackers, pudding, etc.  Resume your normal diet the day after surgery. 5. Most patients will experience some swelling and bruising in the breast.  Ice packs and a good support bra will help.  Swelling and bruising can take several days to resolve.  6. It is common to experience some constipation if taking pain medication after surgery.  Increasing fluid intake and taking a stool softener will usually help or prevent this problem from occurring.  A mild laxative (Milk of Magnesia or Miralax) should be taken according to package directions if there are no bowel movements after 48 hours. 7. Unless discharge instructions indicate otherwise, you may remove your bandages 48 hours after surgery, and you may shower at that time.  You may have steri-strips (small skin tapes) in place directly over the incision.  These strips should be left on the skin.  If your surgeon used skin glue on the incision, you may shower in 24 hours.  The glue will flake off over the next 2-3 weeks.   Any sutures or staples will be removed at the office during your follow-up visit. 8. ACTIVITIES:  You may resume regular daily activities (gradually increasing) beginning the next day.  Wearing a good support bra or sports bra minimizes pain and swelling.  You may have sexual intercourse when it is comfortable. a. You may drive when you no longer are taking prescription pain medication, you can comfortably wear a seatbelt, and you can safely maneuver your car and apply brakes. b. RETURN TO WORK:  ______________________________________________________________________________________ 9. You should see your doctor in the office for a follow-up appointment approximately 2-3 weeks after your surgery. Please call for this appointment.  Expect your pathology report 3 business days after your surgery.  You may call to check if you do not hear from [place] after three days. 10. OTHER INSTRUCTIONS: __Restart Plavix 7/23/2017_____________________________________________________________________________________________ _____________________________________________________________________________________________________________________________________ _____________________________________________________________________________________________________________________________________ _____________________________________________________________________________________________________________________________________  WHEN TO CALL YOUR DOCTOR: 1. Fever over 101.0 2. Nausea and/or vomiting. 3. Extreme swelling or bruising. 4. Continued bleeding from incision. 5. Increased pain, redness, or drainage from the incision.  The clinic staff is available to answer your questions during regular business hours.  Please dont hesitate to call and ask to speak to one of the nurses for clinical concerns.  If you have a medical emergency, go to the nearest emergency room or call 911.  A surgeon from [facility] is always  on call at the hospital.  For further questions, please visit [email or web address]

## 2015-08-02 NOTE — Transfer of Care (Signed)
Immediate Anesthesia Transfer of Care Note  Patient: Christina Austin  Procedure(s) Performed: Procedure(s): BREAST LUMPECTOMY WITH RADIOACTIVE SEED LOCALIZATION (Right)  Patient Location: PACU  Anesthesia Type:General  Level of Consciousness: awake, patient cooperative and responds to stimulation  Airway & Oxygen Therapy: Patient Spontanous Breathing and Patient connected to face mask oxygen  Post-op Assessment: Report given to RN, Post -op Vital signs reviewed and stable and Patient moving all extremities X 4  Post vital signs: Reviewed and stable  Last Vitals:  Filed Vitals:   08/02/15 0720  BP: 165/88  Pulse: 70  Temp: 36.4 C  Resp: 20    Last Pain: There were no vitals filed for this visit.       Complications: No apparent anesthesia complications

## 2015-08-02 NOTE — Anesthesia Postprocedure Evaluation (Signed)
Anesthesia Post Note  Patient: Christina Austin  Procedure(s) Performed: Procedure(s) (LRB): BREAST LUMPECTOMY WITH RADIOACTIVE SEED LOCALIZATION (Right)  Patient location during evaluation: PACU Anesthesia Type: General Level of consciousness: awake and alert and patient cooperative Pain management: pain level controlled Vital Signs Assessment: post-procedure vital signs reviewed and stable Respiratory status: spontaneous breathing and respiratory function stable Cardiovascular status: stable Anesthetic complications: no    Last Vitals:  Filed Vitals:   08/02/15 0930 08/02/15 0945  BP: 145/79 154/85  Pulse: 68 74  Temp:  36 C  Resp: 17 16    Last Pain:  Filed Vitals:   08/02/15 0949  PainSc: 0-No pain                 Mell Mellott S

## 2015-08-02 NOTE — H&P (Signed)
H & P  She was referred by Dr. Radford Pax for consultation regarding a right breast sclerosing papilloma. She was noted to have an irregular mass within the retroareolar right breast position measuring approximately 2 cm and associated with pleomorphic calcifications. This is at the 12 o'clock position. There is a small mass adjacent to this measuring 8 mm. Image guided biopsy demonstrated the above pathology and wider excision was recommended. She had some clear nipple discharge in the past while she was taking hormone replacement therapy, but when she stopped so did the nipple discharge. No family history of breast or ovarian cancer. She took hormone replacement therapy for about 25-30 years. Prior to this finding, she denied any breast masses or pain in the breast.  Allergies Aspirin *ANALGESICS - NonNarcotic*  Prior to Admission medications   Medication Sig Start Date End Date Taking? Authorizing Provider  acetaminophen (TYLENOL) 500 MG tablet Take 500 mg by mouth every 6 (six) hours as needed for mild pain.   Yes Historical Provider, MD  amLODipine (NORVASC) 10 MG tablet Take 10 mg by mouth every evening.    Yes Historical Provider, MD  atorvastatin (LIPITOR) 40 MG tablet Take 20 mg by mouth every evening.   Yes Historical Provider, MD  buPROPion (WELLBUTRIN XL) 300 MG 24 hr tablet Take 300 mg by mouth every morning.   Yes Historical Provider, MD  clopidogrel (PLAVIX) 75 MG tablet Take 75 mg by mouth daily with breakfast.   Yes Historical Provider, MD  COD LIVER OIL PO Take 1 capsule by mouth daily.   Yes Historical Provider, MD  hydrochlorothiazide (HYDRODIURIL) 25 MG tablet Take 25 mg by mouth every evening.    Yes Historical Provider, MD  Multiple Vitamin (MULTI VITAMIN PO) Take 1 tablet by mouth daily.   Yes Historical Provider, MD  omeprazole (PRILOSEC) 20 MG capsule Take 20 mg by mouth daily as needed (heartburn).   Yes Historical Provider, MD    Physical Exam   The physical  exam findings are as follows: Note:General: WDWN in NAD. Pleasant and cooperative. Appears younger than stated age  81: [place]/AT, no facial masses  EYES: EOMI, no icterus  NECK: Supple, no obvious mass .  CV: RRR, no murmur, no JVD.  CHEST: Breath sounds equal and clear. Respirations nonlabored.  BREASTS: Symmetrical in size. No dominant masses, nipple discharge or suspicious skin lesions. Some bruising and needle mark in right breast.  ABDOMEN: Soft, nontender, nondistended, midline scar present.  MUSCULOSKELETAL: FROM, good muscle tone, no edema, no venous stasis changes  LYMPHATIC: No palpable cervical, supraclavicular, axillary adenopathy.  NEUROLOGIC: Alert and oriented, answers questions appropriately, normal gait and station.  PSYCHIATRIC: Normal mood, affect , and behavior.    Assessment & Plan   PAPILLOMA OF RIGHT BREAST (D24.1) Impression: This has sclerosing features which has a higher risk of having a surrounding neoplastic process and we discussed this. Recommendation is for wider excision and she is in agreement with this.  Plan: Right breast lumpectomy after radioactive seed localization. I have explained the procedure, risks, and aftercare to her. Risks include but are not limited to bleeding, infection, wound problems, seroma formation, cosmetic deformity, anesthesia. She seems to [facility] and agrees with the plan. I asked her to stop the Plavix 5 days before surgery.  [name], MD

## 2015-08-02 NOTE — Op Note (Signed)
Operative Note  Christina Austin female 81 y.o. 08/02/2015  PREOPERATIVE DX:  Right breast sclerosing papilloma with microcalcifications  POSTOPERATIVE DX:  Same  PROCEDURE:   Right breast lumpectomy after radioactive seed localization         Surgeon: Chrisy Hillebrand J   Assistants: None  Anesthesia: General LMA anesthesia  Indications:   This is a 81 year old female who is noted to have a irregular retroareolar mass in the right breast with microcalcifications on screening mammogram. Image guided biopsy demonstrated the above pathology. Wider excision has been recommended. She now presents for that. She had successful radioactive seed localization done yesterday.    Procedure Detail:  She was seen in the holding area. I used the neoprobe to verify the seed position. She was brought to the operating room placed supine on the operating table and general anesthetic was given. The right breast was sterilely prepped and draped and a timeout was performed.  Using the neoprobe area of increased counts was noted inferior to the nipple. Local anesthetic consisting of half percent plain Marcaine was infiltrated from the 12:00 to the 6:00 position and  A circumareolar incision was made from 12:00 to 6:00 position through the skin and dermis. Subcutaneous flaps were raised. Using a neoprobe I identified the area of highest counts. I then performed a lumpectomy around the area of highest counts using the neoprobe to guide me.  Once the breast tissue was removed,  The margins were inked. There was no radioactivity in the lumpectomy bed. Specimen mammogram demonstrated the marking clip, the radioactive seed, and the lesion to be in the middle of the specimen and this was verified by the radiologist. Pathology did receive the seed.  Bleeding from the lumpectomy site was controlled with electrocautery. Once hemostasis was adequate, the subcutaneous tissues approximated with interrupted 3-0 Vicryl sutures.  Skin was closed with a 4 Monocryl subcuticular stitch.  Steri-Strips and a sterile dressing were applied. A breast binder was applied.  She tolerated the procedure without apparent complications. Estimated blood loss was less than 100 cc. She was taken to the recovery room in satisfactory condition.          Specimens: right breast tissue.        Complications:  * No complications entered in OR log *         Disposition: PACU - hemodynamically stable.         Condition: stable

## 2015-08-02 NOTE — Anesthesia Procedure Notes (Signed)
Procedure Name: LMA Insertion Date/Time: 08/02/2015 7:47 AM Performed by: Tressia Miners LEFFEW Pre-anesthesia Checklist: Patient identified, Emergency Drugs available, Suction available, Timeout performed and Patient being monitored Patient Re-evaluated:Patient Re-evaluated prior to inductionOxygen Delivery Method: Circle system utilized Preoxygenation: Pre-oxygenation with 100% oxygen Intubation Type: IV induction Ventilation: Mask ventilation without difficulty LMA: LMA inserted LMA Size: 4.0 Number of attempts: 1 Tube secured with: Tape Dental Injury: Teeth and Oropharynx as per pre-operative assessment

## 2015-08-03 ENCOUNTER — Encounter (HOSPITAL_COMMUNITY): Payer: Self-pay | Admitting: General Surgery

## 2015-09-27 ENCOUNTER — Encounter (HOSPITAL_COMMUNITY): Payer: Self-pay

## 2016-02-14 ENCOUNTER — Ambulatory Visit: Payer: Medicare Other | Admitting: Neurology

## 2016-02-15 ENCOUNTER — Encounter: Payer: Self-pay | Admitting: Neurology

## 2016-03-31 ENCOUNTER — Encounter: Payer: Self-pay | Admitting: Cardiology

## 2016-03-31 DIAGNOSIS — E785 Hyperlipidemia, unspecified: Secondary | ICD-10-CM | POA: Insufficient documentation

## 2016-03-31 DIAGNOSIS — I119 Hypertensive heart disease without heart failure: Secondary | ICD-10-CM | POA: Insufficient documentation

## 2016-03-31 DIAGNOSIS — Z8673 Personal history of transient ischemic attack (TIA), and cerebral infarction without residual deficits: Secondary | ICD-10-CM | POA: Insufficient documentation

## 2016-05-01 ENCOUNTER — Ambulatory Visit: Payer: Medicare Other | Admitting: Neurology

## 2016-05-28 DIAGNOSIS — M65341 Trigger finger, right ring finger: Secondary | ICD-10-CM | POA: Diagnosis not present

## 2016-06-04 ENCOUNTER — Ambulatory Visit: Payer: Medicare Other | Admitting: Neurology

## 2016-06-04 DIAGNOSIS — Z029 Encounter for administrative examinations, unspecified: Secondary | ICD-10-CM

## 2016-06-16 DIAGNOSIS — E782 Mixed hyperlipidemia: Secondary | ICD-10-CM | POA: Diagnosis not present

## 2016-06-16 DIAGNOSIS — I10 Essential (primary) hypertension: Secondary | ICD-10-CM | POA: Diagnosis not present

## 2016-06-18 DIAGNOSIS — H25013 Cortical age-related cataract, bilateral: Secondary | ICD-10-CM | POA: Diagnosis not present

## 2016-06-18 DIAGNOSIS — H5203 Hypermetropia, bilateral: Secondary | ICD-10-CM | POA: Diagnosis not present

## 2016-08-22 DIAGNOSIS — N3281 Overactive bladder: Secondary | ICD-10-CM | POA: Diagnosis not present

## 2016-09-04 DIAGNOSIS — Z0000 Encounter for general adult medical examination without abnormal findings: Secondary | ICD-10-CM | POA: Diagnosis not present

## 2016-09-25 DIAGNOSIS — I10 Essential (primary) hypertension: Secondary | ICD-10-CM | POA: Diagnosis not present

## 2016-09-25 DIAGNOSIS — E782 Mixed hyperlipidemia: Secondary | ICD-10-CM | POA: Diagnosis not present

## 2016-09-25 DIAGNOSIS — N3281 Overactive bladder: Secondary | ICD-10-CM | POA: Diagnosis not present

## 2016-09-25 DIAGNOSIS — M130 Polyarthritis, unspecified: Secondary | ICD-10-CM | POA: Diagnosis not present

## 2016-10-01 DIAGNOSIS — M65341 Trigger finger, right ring finger: Secondary | ICD-10-CM | POA: Diagnosis not present

## 2016-10-02 ENCOUNTER — Other Ambulatory Visit: Payer: Self-pay | Admitting: Orthopedic Surgery

## 2016-10-07 ENCOUNTER — Other Ambulatory Visit: Payer: Self-pay | Admitting: Gastroenterology

## 2016-10-07 DIAGNOSIS — Z8719 Personal history of other diseases of the digestive system: Secondary | ICD-10-CM | POA: Diagnosis not present

## 2016-10-07 DIAGNOSIS — K5900 Constipation, unspecified: Secondary | ICD-10-CM | POA: Diagnosis not present

## 2016-10-07 DIAGNOSIS — R339 Retention of urine, unspecified: Secondary | ICD-10-CM | POA: Diagnosis not present

## 2016-10-07 DIAGNOSIS — R159 Full incontinence of feces: Secondary | ICD-10-CM

## 2016-10-10 ENCOUNTER — Other Ambulatory Visit: Payer: Medicare Other

## 2016-10-10 DIAGNOSIS — N390 Urinary tract infection, site not specified: Secondary | ICD-10-CM | POA: Diagnosis not present

## 2016-10-14 DIAGNOSIS — Z01810 Encounter for preprocedural cardiovascular examination: Secondary | ICD-10-CM | POA: Diagnosis not present

## 2016-10-14 DIAGNOSIS — E785 Hyperlipidemia, unspecified: Secondary | ICD-10-CM | POA: Diagnosis not present

## 2016-10-14 DIAGNOSIS — I119 Hypertensive heart disease without heart failure: Secondary | ICD-10-CM | POA: Diagnosis not present

## 2016-10-14 DIAGNOSIS — K219 Gastro-esophageal reflux disease without esophagitis: Secondary | ICD-10-CM | POA: Diagnosis not present

## 2016-10-14 DIAGNOSIS — Z8673 Personal history of transient ischemic attack (TIA), and cerebral infarction without residual deficits: Secondary | ICD-10-CM | POA: Diagnosis not present

## 2016-10-15 ENCOUNTER — Encounter (HOSPITAL_BASED_OUTPATIENT_CLINIC_OR_DEPARTMENT_OTHER): Payer: Self-pay | Admitting: *Deleted

## 2016-10-15 NOTE — Progress Notes (Signed)
Patient has note from Dr Wynonia Lawman that she can stop Plavix 5d before surgery. That not and EKG received from office and on chart.

## 2016-10-16 ENCOUNTER — Other Ambulatory Visit: Payer: Self-pay | Admitting: Gastroenterology

## 2016-10-16 ENCOUNTER — Encounter (HOSPITAL_BASED_OUTPATIENT_CLINIC_OR_DEPARTMENT_OTHER)
Admission: RE | Admit: 2016-10-16 | Discharge: 2016-10-16 | Disposition: A | Discharge status: Home / Self Care | Payer: Medicare Other | Source: Ambulatory Visit | Attending: Orthopedic Surgery | Admitting: Orthopedic Surgery

## 2016-10-16 DIAGNOSIS — R413 Other amnesia: Secondary | ICD-10-CM | POA: Diagnosis not present

## 2016-10-16 DIAGNOSIS — N183 Chronic kidney disease, stage 3 (moderate): Secondary | ICD-10-CM | POA: Diagnosis not present

## 2016-10-16 DIAGNOSIS — M1991 Primary osteoarthritis, unspecified site: Secondary | ICD-10-CM | POA: Diagnosis not present

## 2016-10-16 DIAGNOSIS — I10 Essential (primary) hypertension: Secondary | ICD-10-CM | POA: Diagnosis not present

## 2016-10-16 DIAGNOSIS — Z8673 Personal history of transient ischemic attack (TIA), and cerebral infarction without residual deficits: Secondary | ICD-10-CM | POA: Diagnosis not present

## 2016-10-16 DIAGNOSIS — K219 Gastro-esophageal reflux disease without esophagitis: Secondary | ICD-10-CM | POA: Diagnosis not present

## 2016-10-16 DIAGNOSIS — Z886 Allergy status to analgesic agent status: Secondary | ICD-10-CM | POA: Diagnosis not present

## 2016-10-16 DIAGNOSIS — M722 Plantar fascial fibromatosis: Secondary | ICD-10-CM | POA: Diagnosis not present

## 2016-10-16 DIAGNOSIS — E785 Hyperlipidemia, unspecified: Secondary | ICD-10-CM | POA: Diagnosis not present

## 2016-10-16 DIAGNOSIS — M19012 Primary osteoarthritis, left shoulder: Secondary | ICD-10-CM | POA: Diagnosis not present

## 2016-10-16 DIAGNOSIS — Z90710 Acquired absence of both cervix and uterus: Secondary | ICD-10-CM | POA: Diagnosis not present

## 2016-10-16 DIAGNOSIS — Z800 Family history of malignant neoplasm of digestive organs: Secondary | ICD-10-CM | POA: Diagnosis not present

## 2016-10-16 DIAGNOSIS — I129 Hypertensive chronic kidney disease with stage 1 through stage 4 chronic kidney disease, or unspecified chronic kidney disease: Secondary | ICD-10-CM | POA: Diagnosis not present

## 2016-10-16 DIAGNOSIS — R159 Full incontinence of feces: Secondary | ICD-10-CM

## 2016-10-16 DIAGNOSIS — F419 Anxiety disorder, unspecified: Secondary | ICD-10-CM | POA: Diagnosis not present

## 2016-10-16 DIAGNOSIS — F329 Major depressive disorder, single episode, unspecified: Secondary | ICD-10-CM | POA: Diagnosis not present

## 2016-10-16 DIAGNOSIS — M65341 Trigger finger, right ring finger: Secondary | ICD-10-CM | POA: Diagnosis not present

## 2016-10-16 DIAGNOSIS — M19011 Primary osteoarthritis, right shoulder: Secondary | ICD-10-CM | POA: Diagnosis not present

## 2016-10-16 LAB — BASIC METABOLIC PANEL
ANION GAP: 9 (ref 5–15)
BUN: 24 mg/dL — ABNORMAL HIGH (ref 6–20)
CHLORIDE: 103 mmol/L (ref 101–111)
CO2: 28 mmol/L (ref 22–32)
Calcium: 9.7 mg/dL (ref 8.9–10.3)
Creatinine, Ser: 1.31 mg/dL — ABNORMAL HIGH (ref 0.44–1.00)
GFR calc non Af Amer: 37 mL/min — ABNORMAL LOW (ref >60)
GFR, EST AFRICAN AMERICAN: 43 mL/min — AB (ref >60)
Glucose, Bld: 106 mg/dL — ABNORMAL HIGH (ref 65–99)
Potassium: 3.8 mmol/L (ref 3.5–5.1)
Sodium: 140 mmol/L (ref 135–145)

## 2016-10-21 ENCOUNTER — Encounter (HOSPITAL_BASED_OUTPATIENT_CLINIC_OR_DEPARTMENT_OTHER): Payer: Self-pay

## 2016-10-21 ENCOUNTER — Ambulatory Visit (HOSPITAL_BASED_OUTPATIENT_CLINIC_OR_DEPARTMENT_OTHER): Payer: Medicare Other | Admitting: Certified Registered"

## 2016-10-21 ENCOUNTER — Encounter (HOSPITAL_BASED_OUTPATIENT_CLINIC_OR_DEPARTMENT_OTHER)
Admission: RE | Disposition: A | Discharge status: Home / Self Care | Payer: Self-pay | Source: Ambulatory Visit | Attending: Orthopedic Surgery

## 2016-10-21 ENCOUNTER — Ambulatory Visit (HOSPITAL_BASED_OUTPATIENT_CLINIC_OR_DEPARTMENT_OTHER)
Admission: RE | Admit: 2016-10-21 | Discharge: 2016-10-21 | Disposition: A | Discharge status: Home / Self Care | Payer: Medicare Other | Source: Ambulatory Visit | Attending: Orthopedic Surgery | Admitting: Orthopedic Surgery

## 2016-10-21 DIAGNOSIS — F329 Major depressive disorder, single episode, unspecified: Secondary | ICD-10-CM | POA: Insufficient documentation

## 2016-10-21 DIAGNOSIS — M19012 Primary osteoarthritis, left shoulder: Secondary | ICD-10-CM | POA: Diagnosis not present

## 2016-10-21 DIAGNOSIS — R413 Other amnesia: Secondary | ICD-10-CM | POA: Insufficient documentation

## 2016-10-21 DIAGNOSIS — M65341 Trigger finger, right ring finger: Secondary | ICD-10-CM | POA: Diagnosis not present

## 2016-10-21 DIAGNOSIS — M65841 Other synovitis and tenosynovitis, right hand: Secondary | ICD-10-CM | POA: Diagnosis not present

## 2016-10-21 DIAGNOSIS — F419 Anxiety disorder, unspecified: Secondary | ICD-10-CM | POA: Insufficient documentation

## 2016-10-21 DIAGNOSIS — Z90710 Acquired absence of both cervix and uterus: Secondary | ICD-10-CM | POA: Insufficient documentation

## 2016-10-21 DIAGNOSIS — E785 Hyperlipidemia, unspecified: Secondary | ICD-10-CM | POA: Insufficient documentation

## 2016-10-21 DIAGNOSIS — I119 Hypertensive heart disease without heart failure: Secondary | ICD-10-CM | POA: Diagnosis not present

## 2016-10-21 DIAGNOSIS — N183 Chronic kidney disease, stage 3 (moderate): Secondary | ICD-10-CM | POA: Insufficient documentation

## 2016-10-21 DIAGNOSIS — I129 Hypertensive chronic kidney disease with stage 1 through stage 4 chronic kidney disease, or unspecified chronic kidney disease: Secondary | ICD-10-CM | POA: Insufficient documentation

## 2016-10-21 DIAGNOSIS — M1991 Primary osteoarthritis, unspecified site: Secondary | ICD-10-CM | POA: Insufficient documentation

## 2016-10-21 DIAGNOSIS — Z886 Allergy status to analgesic agent status: Secondary | ICD-10-CM | POA: Insufficient documentation

## 2016-10-21 DIAGNOSIS — M19011 Primary osteoarthritis, right shoulder: Secondary | ICD-10-CM | POA: Diagnosis not present

## 2016-10-21 DIAGNOSIS — K219 Gastro-esophageal reflux disease without esophagitis: Secondary | ICD-10-CM | POA: Diagnosis not present

## 2016-10-21 DIAGNOSIS — I10 Essential (primary) hypertension: Secondary | ICD-10-CM | POA: Insufficient documentation

## 2016-10-21 DIAGNOSIS — M722 Plantar fascial fibromatosis: Secondary | ICD-10-CM | POA: Diagnosis not present

## 2016-10-21 DIAGNOSIS — Z800 Family history of malignant neoplasm of digestive organs: Secondary | ICD-10-CM | POA: Diagnosis not present

## 2016-10-21 DIAGNOSIS — Z8673 Personal history of transient ischemic attack (TIA), and cerebral infarction without residual deficits: Secondary | ICD-10-CM | POA: Insufficient documentation

## 2016-10-21 HISTORY — DX: Cerebral infarction, unspecified: I63.9

## 2016-10-21 HISTORY — DX: Other amnesia: R41.3

## 2016-10-21 HISTORY — PX: TRIGGER FINGER RELEASE: SHX641

## 2016-10-21 HISTORY — DX: Hypertensive heart disease without heart failure: I11.9

## 2016-10-21 SURGERY — RELEASE, A1 PULLEY, FOR TRIGGER FINGER
Anesthesia: Regional | Site: Hand | Laterality: Right

## 2016-10-21 MED ORDER — LACTATED RINGERS IV SOLN
INTRAVENOUS | Status: DC
  Administered 2016-10-21: 09:00:00 via INTRAVENOUS

## 2016-10-21 MED ORDER — LIDOCAINE HCL (PF) 0.5 % IJ SOLN
INTRAMUSCULAR | Status: DC | PRN
  Administered 2016-10-21: 35 mL via INTRAVENOUS

## 2016-10-21 MED ORDER — KETOROLAC TROMETHAMINE 30 MG/ML IJ SOLN
INTRAMUSCULAR | Status: AC
  Filled 2016-10-21: qty 1

## 2016-10-21 MED ORDER — MIDAZOLAM HCL 2 MG/2ML IJ SOLN: 1.0000 mg | INTRAMUSCULAR | Status: DC | PRN

## 2016-10-21 MED ORDER — FENTANYL CITRATE (PF) 100 MCG/2ML IJ SOLN
INTRAMUSCULAR | Status: AC
  Filled 2016-10-21: qty 2

## 2016-10-21 MED ORDER — METOCLOPRAMIDE HCL 5 MG/ML IJ SOLN
10.0000 mg | Freq: Once | INTRAMUSCULAR | Status: DC | PRN
Start: 2016-10-21 — End: 2016-10-21

## 2016-10-21 MED ORDER — ONDANSETRON HCL 4 MG/2ML IJ SOLN
INTRAMUSCULAR | Status: DC | PRN
  Administered 2016-10-21: 4 mg via INTRAVENOUS

## 2016-10-21 MED ORDER — PROPOFOL 500 MG/50ML IV EMUL
INTRAVENOUS | Status: DC | PRN
  Administered 2016-10-21: 75 ug/kg/min via INTRAVENOUS

## 2016-10-21 MED ORDER — BUPIVACAINE HCL (PF) 0.5 % IJ SOLN
INTRAMUSCULAR | Status: DC | PRN
  Administered 2016-10-21: 5 mL

## 2016-10-21 MED ORDER — CHLORHEXIDINE GLUCONATE 4 % EX LIQD: 60.0000 mL | Freq: Once | CUTANEOUS | Status: DC

## 2016-10-21 MED ORDER — SCOPOLAMINE 1 MG/3DAYS TD PT72: 1.0000 | MEDICATED_PATCH | Freq: Once | TRANSDERMAL | Status: DC | PRN

## 2016-10-21 MED ORDER — CEFAZOLIN SODIUM-DEXTROSE 2-4 GM/100ML-% IV SOLN
2.0000 g | INTRAVENOUS | Status: AC
  Administered 2016-10-21: 1 g via INTRAVENOUS

## 2016-10-21 MED ORDER — FENTANYL CITRATE (PF) 100 MCG/2ML IJ SOLN
50.0000 ug | INTRAMUSCULAR | Status: DC | PRN
  Administered 2016-10-21: 25 ug via INTRAVENOUS

## 2016-10-21 MED ORDER — MEPERIDINE HCL 25 MG/ML IJ SOLN: 6.2500 mg | INTRAMUSCULAR | Status: DC | PRN

## 2016-10-21 MED ORDER — ONDANSETRON HCL 4 MG/2ML IJ SOLN
INTRAMUSCULAR | Status: AC
  Filled 2016-10-21: qty 4

## 2016-10-21 MED ORDER — TRAMADOL HCL 50 MG PO TABS: 50.0000 mg | ORAL_TABLET | Freq: Four times a day (QID) | ORAL | 0 refills | Status: DC | PRN

## 2016-10-21 MED ORDER — FENTANYL CITRATE (PF) 100 MCG/2ML IJ SOLN: 25.0000 ug | INTRAMUSCULAR | Status: DC | PRN

## 2016-10-21 MED ORDER — MIDAZOLAM HCL 2 MG/2ML IJ SOLN
INTRAMUSCULAR | Status: AC
  Filled 2016-10-21: qty 2

## 2016-10-21 SURGICAL SUPPLY — 32 items
BANDAGE COBAN STERILE 2 (GAUZE/BANDAGES/DRESSINGS) ×2 IMPLANT
BLADE SURG 15 STRL LF DISP TIS (BLADE) ×1 IMPLANT
BLADE SURG 15 STRL SS (BLADE) ×1
BNDG ESMARK 4X9 LF (GAUZE/BANDAGES/DRESSINGS) IMPLANT
CHLORAPREP W/TINT 26ML (MISCELLANEOUS) ×2 IMPLANT
CORD BIPOLAR FORCEPS 12FT (ELECTRODE) IMPLANT
COVER BACK TABLE 60X90IN (DRAPES) ×2 IMPLANT
COVER MAYO STAND STRL (DRAPES) ×2 IMPLANT
CUFF TOURNIQUET SINGLE 18IN (TOURNIQUET CUFF) ×2 IMPLANT
DECANTER SPIKE VIAL GLASS SM (MISCELLANEOUS) IMPLANT
DRAPE EXTREMITY T 121X128X90 (DRAPE) ×2 IMPLANT
DRAPE SURG 17X23 STRL (DRAPES) ×2 IMPLANT
GAUZE SPONGE 4X4 12PLY STRL (GAUZE/BANDAGES/DRESSINGS) ×2 IMPLANT
GAUZE XEROFORM 1X8 LF (GAUZE/BANDAGES/DRESSINGS) ×2 IMPLANT
GLOVE BIO SURGEON STRL SZ 6.5 (GLOVE) ×2 IMPLANT
GLOVE BIOGEL PI IND STRL 6.5 (GLOVE) ×1 IMPLANT
GLOVE BIOGEL PI IND STRL 8.5 (GLOVE) ×1 IMPLANT
GLOVE BIOGEL PI INDICATOR 6.5 (GLOVE) ×1
GLOVE BIOGEL PI INDICATOR 8.5 (GLOVE) ×1
GLOVE SURG ORTHO 8.0 STRL STRW (GLOVE) ×2 IMPLANT
GOWN STRL REUS W/ TWL LRG LVL3 (GOWN DISPOSABLE) ×1 IMPLANT
GOWN STRL REUS W/TWL LRG LVL3 (GOWN DISPOSABLE) ×1
GOWN STRL REUS W/TWL XL LVL3 (GOWN DISPOSABLE) ×2 IMPLANT
NEEDLE PRECISIONGLIDE 27X1.5 (NEEDLE) ×2 IMPLANT
NS IRRIG 1000ML POUR BTL (IV SOLUTION) ×2 IMPLANT
PACK BASIN DAY SURGERY FS (CUSTOM PROCEDURE TRAY) ×2 IMPLANT
STOCKINETTE 4X48 STRL (DRAPES) ×2 IMPLANT
SUT ETHILON 4 0 PS 2 18 (SUTURE) ×2 IMPLANT
SYR BULB 3OZ (MISCELLANEOUS) ×2 IMPLANT
SYR CONTROL 10ML LL (SYRINGE) ×2 IMPLANT
TOWEL OR 17X24 6PK STRL BLUE (TOWEL DISPOSABLE) ×4 IMPLANT
UNDERPAD 30X30 (UNDERPADS AND DIAPERS) ×2 IMPLANT

## 2016-10-21 NOTE — Discharge Instructions (Addendum)

## 2016-10-21 NOTE — Anesthesia Procedure Notes (Signed)
Anesthesia Regional Block: Bier block (IV Regional)   Pre-Anesthetic Checklist: ,, timeout performed, Correct Patient, Correct Site, Correct Laterality, Correct Procedure, Correct Position, site marked, Risks and benefits discussed, Surgical consent,  Pre-op evaluation,  At surgeon's request  Laterality: Right  Prep: chloraprep, alcohol swabs       Needles:  Injection technique: Single-shot      Additional Needles:   Procedures:,,,,,,,, #20gu IV placed  Narrative:

## 2016-10-21 NOTE — Op Note (Signed)
Dictation Number [id number]

## 2016-10-21 NOTE — Anesthesia Preprocedure Evaluation (Signed)
Anesthesia Evaluation  Patient identified by MRN, date of birth, ID band Patient awake    Reviewed: Allergy & Precautions, NPO status , Patient's Chart, lab work & pertinent test results  Airway Mallampati: II  TM Distance: >3 FB Neck ROM: Full    Dental no notable dental hx. (+) Upper Dentures, Partial Lower   Pulmonary neg pulmonary ROS,    Pulmonary exam normal breath sounds clear to auscultation       Cardiovascular hypertension, Pt. on medications Normal cardiovascular exam Rhythm:Regular Rate:Normal     Neuro/Psych TIAnegative psych ROS   GI/Hepatic Neg liver ROS, GERD  ,  Endo/Other  negative endocrine ROS  Renal/GU Renal InsufficiencyRenal disease  negative genitourinary   Musculoskeletal negative musculoskeletal ROS (+)   Abdominal   Peds negative pediatric ROS (+)  Hematology negative hematology ROS (+)   Anesthesia Other Findings   Reproductive/Obstetrics negative OB ROS                             Anesthesia Physical Anesthesia Plan  ASA: III  Anesthesia Plan: Bier Block   Post-op Pain Management:    Induction:   PONV Risk Score and Plan: 2 and Ondansetron and Treatment may vary due to age or medical condition  Airway Management Planned: Simple Face Mask  Additional Equipment:   Intra-op Plan:   Post-operative Plan:   Informed Consent: I have reviewed the patients History and Physical, chart, labs and discussed the procedure including the risks, benefits and alternatives for the proposed anesthesia with the patient or authorized representative who has indicated his/her understanding and acceptance.   Dental advisory given  Plan Discussed with:   Anesthesia Plan Comments:         Anesthesia Quick Evaluation

## 2016-10-21 NOTE — Op Note (Signed)
NAMESHANTANIQUE, HODO                 ACCOUNT NO.:  [id number]  MEDICAL RECORD NO.:  [id number]  LOCATION:                                 FACILITY:  PHYSICIAN:  Daryll Brod, M.D.            DATE OF BIRTH:  DATE OF PROCEDURE:  10/21/2016 DATE OF DISCHARGE:                              OPERATIVE REPORT   PREOPERATIVE DIAGNOSIS:  Stenosing tenosynovitis, right ring finger.  POSTOPERATIVE DIAGNOSIS:  Stenosing tenosynovitis, right ring finger.  OPERATION:  Release of A1 pulley, right ring finger.  SURGEON:  Daryll Brod, MD.  ANESTHESIA:  Forearm-based IV regional with local infiltration.  PLACE OF SURGERY:  [facility].  ANESTHESIOLOGIST:  [place].  HISTORY:  The patient is an 82 year old female with history of triggering of her right ring finger.  This has not responded to conservative treatment.  She has elected to undergo surgical release of the A1 pulley.  Pre, peri, and postoperative course have been discussed along with risks and complications.  She is aware that there is no guarantee to the surgery; the possibility of infection; recurrence of injury to arteries, nerves, tendons; incomplete relief of symptoms and dystrophy.  In the preoperative area, the patient was seen, the extremity marked by both patient and surgeon.  Antibiotic given.  PROCEDURE IN DETAIL:  The patient was brought to the operating room, where a forearm based IV regional anesthetic was carried out without difficulty under the direction of the Anesthesia Department.  She was prepped using ChloraPrep in the supine position with right arm free.  A 3-minute dry time was allowed, and a time-out taken, confirming the patient and procedure.  An oblique incision was made over the A1 pulley of the right ring finger.  This was carried down through subcutaneous tissue.  Bleeders were electrocauterized with bipolar as necessary. Retractors were placed protecting neurovascular bundles radially  and ulnarly.  The A1 pulley was then released on its radial aspect.  A cyst was present, which was excised.  A small incision was made centrally in A2.  The 2 flexor tendons were then separated to break any adhesions between the tenosynovial tissue.  The finger placed through a full passive range of motion.  No further triggering was noted.  The wound was copiously irrigated with saline.  The skin was then closed with interrupted 4-0 nylon sutures.  Local infiltration with 0.25% bupivacaine without epinephrine was given.  Approximately 5 mL was used. A sterile compressive dressing with the fingers free was applied.  On deflation of the tourniquet, all fingers immediately pinked.  She was taken to the recovery room for observation in satisfactory condition.  She will be discharged to home to return to the [facility] in 1 week, on [facility].          ______________________________ Daryll Brod, M.D.     GK/MEDQ  D:  10/21/2016  T:  10/21/2016  Job:  [id number]

## 2016-10-21 NOTE — Anesthesia Postprocedure Evaluation (Signed)
Anesthesia Post Note  Patient: Christina Austin  Procedure(s) Performed: RELEASE TRIGGER FINGER/A-1 PULLEY RIGHT RING FINGER (Right Hand)     Patient location during evaluation: PACU Anesthesia Type: Bier Block Level of consciousness: awake and alert Pain management: pain level controlled Vital Signs Assessment: post-procedure vital signs reviewed and stable Respiratory status: spontaneous breathing, nonlabored ventilation, respiratory function stable and patient connected to nasal cannula oxygen Cardiovascular status: stable and blood pressure returned to baseline Postop Assessment: no apparent nausea or vomiting Anesthetic complications: no    Last Vitals:  Vitals:   10/21/16 1027 10/21/16 1053  BP: 112/67 133/69  Pulse:  70  Resp:  16  Temp:  36.4 C  SpO2:  100%    Last Pain:  Vitals:   10/21/16 1053  TempSrc: Oral  PainSc: 0-No pain                 Montez Hageman

## 2016-10-21 NOTE — H&P (Signed)
Christina Austin is an 82 y.o. female.   Chief Complaint:catching right ring finger HPI: Christina Austin is an 82 yo female with  right ring finger trigger.  This been injected on 2 occasions. She continues to complain of catching discomfort this is now affecting to a slight extent her small finger also. She has no numbness or tingling. Her pain is dull aching in nature. She has no history of diabetes thyroid problems arthritis gout. Family history is positive for diabetes negative for thyroid problems arthritis and gout.      Past Medical History:  Diagnosis Date  . Anxiety   . Arthritis    legs, shoulders   . Benign essential HTN   . Chronic kidney disease, stage III (moderate) (HCC)    chronic kidney disease stage 3  . Depression   . GERD (gastroesophageal reflux disease)   . History of blood transfusion    post hysterectomy  . Hypertensive heart disease without heart failure   . Memory deficit   . Neuromuscular disorder (HCC)    intermittent facial weakness - but at times its severe enough that "I have gone to the hospital", [place] of state but here in [place]- seen by Dr. Loretta Plume  . Numbness of face   . Other and unspecified hyperlipidemia   . Plantar fasciitis   . Stroke [facility])    TIA  . Unspecified transient cerebral ischemia    takes plavix for 2ndary stroke prevention per Dr. Tomi Likens with neurology    Past Surgical History:  Procedure Laterality Date  . BILATERAL SALPINGOOPHORECTOMY    . BREAST LUMPECTOMY WITH RADIOACTIVE SEED LOCALIZATION Right 08/02/2015   Procedure: BREAST LUMPECTOMY WITH RADIOACTIVE SEED LOCALIZATION;  Surgeon: [name], MD;  Location: [place];  Service: General;  Laterality: Right;  . colonoscopy     removed polyps- to f/u in 6 months  . TOTAL ABDOMINAL HYSTERECTOMY      Family History  Problem Relation Age of Onset  . Cancer Mother        panceratic   Social History:  reports that she has never smoked. She has never used smokeless tobacco. She reports  that she does not drink alcohol or use drugs.  Allergies:  Allergies  Allergen Reactions  . Aspirin     GI Bleed     No prescriptions prior to admission.    No results found for this or any previous visit (from the past 48 hour(s)).  No results found.   Pertinent items are noted in HPI.  Height 5\' 3"  (1.6 m), weight 65.8 kg (145 lb).  General appearance: alert, cooperative and appears stated age Head: Normocephalic, without obvious abnormality Neck: no JVD and thyroid not enlarged, symmetric, no tenderness/mass/nodules Resp: clear to auscultation bilaterally Cardio: regular rate and rhythm, S1, S2 normal, no murmur, click, rub or gallop GI: soft, non-tender; bowel sounds normal; no masses,  no organomegaly Extremities: catching right ring finger Pulses: 2+ and symmetric Skin: Skin color, texture, turgor normal. No rashes or lesions Neurologic: Grossly normal Incision/Wound: na  Assessment/Plan Assessment:  1. Trigger ring finger of right hand    Plan: This has not responded to conservative treatment with 2 injections. We have recommended surgical decompression. Discussed that her discomfort in the ring is probably transmitted to the small finger through the superficialis tendon. She shows no discrete triggering of that finger at the present time. We have discussed surgical treatment with release of the A1 pulley. Pre-peri-and postoperative course are discussed along with risk  complications. She is where there is no guarantee to the surgery the possibility of infection recurrence injury to arteries nerves tendons complete relief symptoms dystrophy the possibility of removal of one limb of the superficialis. Questions are encouraged and answered her satisfaction. She is scheduled for release A1 pulley right ring finger as an outpatient under regional anesthesia.       Denee Boeder R 10/21/2016, 3:50 AM

## 2016-10-21 NOTE — Transfer of Care (Signed)
Immediate Anesthesia Transfer of Care Note  Patient: Christina Austin  Procedure(s) Performed: RELEASE TRIGGER FINGER/A-1 PULLEY RIGHT RING FINGER (Right Hand)  Patient Location: PACU  Anesthesia Type:MAC and Bier block  Level of Consciousness: awake, alert  and oriented  Airway & Oxygen Therapy: Patient Spontanous Breathing  Post-op Assessment: Report given to RN and Post -op Vital signs reviewed and stable  Post vital signs: Reviewed and stable  Last Vitals:  Vitals:   10/21/16 0833  BP: 123/77  Pulse: 73  Resp: 18  Temp: 36.7 C  SpO2: 100%    Last Pain:  Vitals:   10/21/16 0833  TempSrc: Oral         Complications: No apparent anesthesia complications

## 2016-10-21 NOTE — Brief Op Note (Signed)
10/21/2016  9:58 AM  PATIENT:  Christianne Dolin  82 y.o. female  PRE-OPERATIVE DIAGNOSIS:  STENOSING TENOSYNOVITIS  POST-OPERATIVE DIAGNOSIS:  STENOSING TENOSYNOVITIS  PROCEDURE:  Procedure(s): RELEASE TRIGGER FINGER/A-1 PULLEY RIGHT RING FINGER (Right)  SURGEON:  Surgeon(s) and Role:    * Daryll Brod, MD - Primary  PHYSICIAN ASSISTANT:   ASSISTANTS: none   ANESTHESIA:   local, regional and IV sedation  EBL:  Total I/O In: -  Out: 5 [Blood:5]  BLOOD ADMINISTERED:none  DRAINS: none   LOCAL MEDICATIONS USED:  BUPIVICAINE   SPECIMEN:  No Specimen  DISPOSITION OF SPECIMEN:  N/A  COUNTS:  YES  TOURNIQUET:   Total Tourniquet Time Documented: Forearm (Right) - 15 minutes Total: Forearm (Right) - 15 minutes   DICTATION: .Other Dictation: Dictation Number [id number]  PLAN OF CARE: Discharge to home after PACU  PATIENT DISPOSITION:  PACU - hemodynamically stable.

## 2016-10-22 ENCOUNTER — Encounter (HOSPITAL_BASED_OUTPATIENT_CLINIC_OR_DEPARTMENT_OTHER): Payer: Self-pay | Admitting: Orthopedic Surgery

## 2016-10-27 ENCOUNTER — Ambulatory Visit
Admission: RE | Admit: 2016-10-27 | Discharge: 2016-10-27 | Disposition: A | Discharge status: Home / Self Care | Payer: Medicare Other | Source: Ambulatory Visit | Attending: Gastroenterology | Admitting: Gastroenterology

## 2016-10-27 DIAGNOSIS — R159 Full incontinence of feces: Secondary | ICD-10-CM

## 2016-10-27 DIAGNOSIS — K5900 Constipation, unspecified: Secondary | ICD-10-CM | POA: Diagnosis not present

## 2016-10-27 MED ORDER — IOPAMIDOL (ISOVUE-300) INJECTION 61%
80.0000 mL | Freq: Once | INTRAVENOUS | Status: AC | PRN
Start: 2016-10-27 — End: 2016-10-27
  Administered 2016-10-27: 80 mL via INTRAVENOUS

## 2016-11-04 DIAGNOSIS — N390 Urinary tract infection, site not specified: Secondary | ICD-10-CM | POA: Diagnosis not present

## 2016-11-05 DIAGNOSIS — R195 Other fecal abnormalities: Secondary | ICD-10-CM | POA: Diagnosis not present

## 2016-11-06 DIAGNOSIS — N3281 Overactive bladder: Secondary | ICD-10-CM | POA: Diagnosis not present

## 2016-11-06 DIAGNOSIS — I10 Essential (primary) hypertension: Secondary | ICD-10-CM | POA: Diagnosis not present

## 2016-11-06 DIAGNOSIS — M130 Polyarthritis, unspecified: Secondary | ICD-10-CM | POA: Diagnosis not present

## 2016-11-19 DIAGNOSIS — R1031 Right lower quadrant pain: Secondary | ICD-10-CM | POA: Diagnosis not present

## 2016-12-16 DIAGNOSIS — H25043 Posterior subcapsular polar age-related cataract, bilateral: Secondary | ICD-10-CM | POA: Diagnosis not present

## 2016-12-16 DIAGNOSIS — H2511 Age-related nuclear cataract, right eye: Secondary | ICD-10-CM | POA: Diagnosis not present

## 2016-12-16 DIAGNOSIS — H2513 Age-related nuclear cataract, bilateral: Secondary | ICD-10-CM | POA: Diagnosis not present

## 2016-12-16 DIAGNOSIS — H02839 Dermatochalasis of unspecified eye, unspecified eyelid: Secondary | ICD-10-CM | POA: Diagnosis not present

## 2016-12-16 DIAGNOSIS — H25013 Cortical age-related cataract, bilateral: Secondary | ICD-10-CM | POA: Diagnosis not present

## 2017-01-21 DIAGNOSIS — I10 Essential (primary) hypertension: Secondary | ICD-10-CM | POA: Diagnosis not present

## 2017-01-26 DIAGNOSIS — H2511 Age-related nuclear cataract, right eye: Secondary | ICD-10-CM | POA: Diagnosis not present

## 2017-01-27 DIAGNOSIS — H2512 Age-related nuclear cataract, left eye: Secondary | ICD-10-CM | POA: Diagnosis not present

## 2017-01-29 DIAGNOSIS — J209 Acute bronchitis, unspecified: Secondary | ICD-10-CM | POA: Diagnosis not present

## 2017-01-29 DIAGNOSIS — J029 Acute pharyngitis, unspecified: Secondary | ICD-10-CM | POA: Diagnosis not present

## 2017-01-29 DIAGNOSIS — J181 Lobar pneumonia, unspecified organism: Secondary | ICD-10-CM | POA: Diagnosis not present

## 2017-01-29 DIAGNOSIS — R509 Fever, unspecified: Secondary | ICD-10-CM | POA: Diagnosis not present

## 2017-02-02 DIAGNOSIS — J181 Lobar pneumonia, unspecified organism: Secondary | ICD-10-CM | POA: Diagnosis not present

## 2017-02-02 DIAGNOSIS — H44513 Absolute glaucoma, bilateral: Secondary | ICD-10-CM | POA: Diagnosis not present

## 2017-02-02 DIAGNOSIS — R509 Fever, unspecified: Secondary | ICD-10-CM | POA: Diagnosis not present

## 2017-02-02 DIAGNOSIS — J209 Acute bronchitis, unspecified: Secondary | ICD-10-CM | POA: Diagnosis not present

## 2017-02-05 DIAGNOSIS — J181 Lobar pneumonia, unspecified organism: Secondary | ICD-10-CM | POA: Diagnosis not present

## 2017-02-05 DIAGNOSIS — Z79899 Other long term (current) drug therapy: Secondary | ICD-10-CM | POA: Diagnosis not present

## 2017-02-05 DIAGNOSIS — J209 Acute bronchitis, unspecified: Secondary | ICD-10-CM | POA: Diagnosis not present

## 2017-02-05 DIAGNOSIS — R05 Cough: Secondary | ICD-10-CM | POA: Diagnosis not present

## 2017-02-05 DIAGNOSIS — H6123 Impacted cerumen, bilateral: Secondary | ICD-10-CM | POA: Diagnosis not present

## 2017-02-20 DIAGNOSIS — H6123 Impacted cerumen, bilateral: Secondary | ICD-10-CM | POA: Diagnosis not present

## 2017-02-20 DIAGNOSIS — R05 Cough: Secondary | ICD-10-CM | POA: Diagnosis not present

## 2017-02-20 DIAGNOSIS — J209 Acute bronchitis, unspecified: Secondary | ICD-10-CM | POA: Diagnosis not present

## 2017-02-20 DIAGNOSIS — J181 Lobar pneumonia, unspecified organism: Secondary | ICD-10-CM | POA: Diagnosis not present

## 2017-02-25 DIAGNOSIS — J181 Lobar pneumonia, unspecified organism: Secondary | ICD-10-CM | POA: Diagnosis not present

## 2017-02-27 DIAGNOSIS — H2512 Age-related nuclear cataract, left eye: Secondary | ICD-10-CM | POA: Diagnosis not present

## 2017-03-13 DIAGNOSIS — H6123 Impacted cerumen, bilateral: Secondary | ICD-10-CM | POA: Diagnosis not present

## 2017-03-13 DIAGNOSIS — J181 Lobar pneumonia, unspecified organism: Secondary | ICD-10-CM | POA: Diagnosis not present

## 2017-03-13 DIAGNOSIS — H44513 Absolute glaucoma, bilateral: Secondary | ICD-10-CM | POA: Diagnosis not present

## 2017-03-13 DIAGNOSIS — N19 Unspecified kidney failure: Secondary | ICD-10-CM | POA: Diagnosis not present

## 2017-06-22 DIAGNOSIS — E782 Mixed hyperlipidemia: Secondary | ICD-10-CM | POA: Diagnosis not present

## 2017-06-22 DIAGNOSIS — M130 Polyarthritis, unspecified: Secondary | ICD-10-CM | POA: Diagnosis not present

## 2017-06-22 DIAGNOSIS — I10 Essential (primary) hypertension: Secondary | ICD-10-CM | POA: Diagnosis not present

## 2017-09-17 DIAGNOSIS — L089 Local infection of the skin and subcutaneous tissue, unspecified: Secondary | ICD-10-CM | POA: Diagnosis not present

## 2017-09-17 DIAGNOSIS — L669 Cicatricial alopecia, unspecified: Secondary | ICD-10-CM | POA: Diagnosis not present

## 2017-09-21 DIAGNOSIS — M130 Polyarthritis, unspecified: Secondary | ICD-10-CM | POA: Diagnosis not present

## 2017-09-21 DIAGNOSIS — E782 Mixed hyperlipidemia: Secondary | ICD-10-CM | POA: Diagnosis not present

## 2017-09-21 DIAGNOSIS — I10 Essential (primary) hypertension: Secondary | ICD-10-CM | POA: Diagnosis not present

## 2017-09-21 DIAGNOSIS — J301 Allergic rhinitis due to pollen: Secondary | ICD-10-CM | POA: Diagnosis not present

## 2017-09-24 ENCOUNTER — Telehealth (HOSPITAL_COMMUNITY): Payer: Self-pay | Admitting: Surgery

## 2017-09-24 NOTE — Telephone Encounter (Signed)
Received orders from Dr. Lucianne Lei- requesting ABI's and LE arterial duplex for leg pain.  A voicemail was left at [phone] for patient to call Rip Harbour at [facility] to arrange an appt.

## 2017-09-30 ENCOUNTER — Other Ambulatory Visit (HOSPITAL_COMMUNITY): Payer: Self-pay | Admitting: Family Medicine

## 2017-09-30 DIAGNOSIS — M79606 Pain in leg, unspecified: Secondary | ICD-10-CM

## 2017-10-01 ENCOUNTER — Inpatient Hospital Stay (HOSPITAL_COMMUNITY): Admission: RE | Admit: 2017-10-01 | Payer: Medicare Other | Source: Ambulatory Visit

## 2017-10-01 ENCOUNTER — Encounter (HOSPITAL_COMMUNITY): Payer: Medicare Other

## 2017-10-05 ENCOUNTER — Encounter (HOSPITAL_COMMUNITY): Payer: Medicare Other

## 2017-10-16 ENCOUNTER — Ambulatory Visit (HOSPITAL_COMMUNITY)
Admission: RE | Admit: 2017-10-16 | Discharge: 2017-10-16 | Disposition: A | Discharge status: Home / Self Care | Payer: Medicare Other | Source: Ambulatory Visit | Attending: Family Medicine | Admitting: Family Medicine

## 2017-10-16 ENCOUNTER — Encounter (HOSPITAL_COMMUNITY): Payer: Self-pay

## 2017-10-16 ENCOUNTER — Other Ambulatory Visit (HOSPITAL_COMMUNITY): Payer: Self-pay | Admitting: Family Medicine

## 2017-10-16 DIAGNOSIS — M79606 Pain in leg, unspecified: Secondary | ICD-10-CM

## 2017-10-16 DIAGNOSIS — E785 Hyperlipidemia, unspecified: Secondary | ICD-10-CM | POA: Diagnosis not present

## 2017-10-16 DIAGNOSIS — I10 Essential (primary) hypertension: Secondary | ICD-10-CM | POA: Insufficient documentation

## 2017-10-27 ENCOUNTER — Encounter: Payer: Self-pay | Admitting: Physical Therapy

## 2017-10-27 ENCOUNTER — Ambulatory Visit: Payer: Medicare Other | Attending: Family Medicine | Admitting: Physical Therapy

## 2017-10-27 ENCOUNTER — Other Ambulatory Visit: Payer: Self-pay

## 2017-10-27 DIAGNOSIS — R296 Repeated falls: Secondary | ICD-10-CM | POA: Diagnosis not present

## 2017-10-27 DIAGNOSIS — R2681 Unsteadiness on feet: Secondary | ICD-10-CM | POA: Diagnosis not present

## 2017-10-27 DIAGNOSIS — R2689 Other abnormalities of gait and mobility: Secondary | ICD-10-CM

## 2017-10-27 DIAGNOSIS — R278 Other lack of coordination: Secondary | ICD-10-CM | POA: Diagnosis not present

## 2017-10-27 DIAGNOSIS — M6281 Muscle weakness (generalized): Secondary | ICD-10-CM | POA: Diagnosis not present

## 2017-10-27 NOTE — Therapy (Signed)
[facility] [address] [place], [place], [address] Phone: [phone]   Fax:  [phone]  Physical Therapy Evaluation  Patient Details  Name: Christina Austin MRN: [id number] Date of Birth: 1934/02/21 Referring Provider (PT): Dr. Lucianne Lei   Encounter Date: 10/27/2017  PT End of Session - 10/27/17 1213    Visit Number  1    Number of Visits  17    Date for PT Re-Evaluation  12/26/17    Authorization Type  [employer] Medicare- * requires 10th visit PN    PT Start Time  1100    PT Stop Time  1145    PT Time Calculation (min)  45 min    Activity Tolerance  Patient tolerated treatment well    Behavior During Therapy  [facility] for tasks assessed/performed       Past Medical History:  Diagnosis Date  . Anxiety   . Arthritis    legs, shoulders   . Benign essential HTN   . Chronic kidney disease, stage III (moderate) (HCC)    chronic kidney disease stage 3  . Depression   . GERD (gastroesophageal reflux disease)   . History of blood transfusion    post hysterectomy  . Hypertensive heart disease without heart failure   . Memory deficit   . Neuromuscular disorder (HCC)    intermittent facial weakness - but at times its severe enough that "I have gone to the hospital", [place] state but here in [place]- seen by Dr. Loretta Plume  . Numbness of face   . Other and unspecified hyperlipidemia   . Plantar fasciitis   . Stroke [facility])    TIA  . Unspecified transient cerebral ischemia    takes plavix for 2ndary stroke prevention per Dr. Tomi Likens with neurology    Past Surgical History:  Procedure Laterality Date  . BILATERAL SALPINGOOPHORECTOMY    . BREAST LUMPECTOMY WITH RADIOACTIVE SEED LOCALIZATION Right 08/02/2015   Procedure: BREAST LUMPECTOMY WITH RADIOACTIVE SEED LOCALIZATION;  Surgeon: [name], MD;  Location: [place];  Service: General;  Laterality: Right;  . colonoscopy     removed polyps- to f/u in 6 months  . TOTAL ABDOMINAL  HYSTERECTOMY    . TRIGGER FINGER RELEASE Right 10/21/2016   Procedure: RELEASE TRIGGER FINGER/A-1 PULLEY RIGHT RING FINGER;  Surgeon: Daryll Brod, MD;  Location: [place];  Service: Orthopedics;  Laterality: Right;    There were no vitals filed for this visit.   Subjective Assessment - 10/27/17 1109    Subjective  Reports both of her legs hurt all the time. Currently L LE> R LE. Reports both are weak and began noticing symptoms years ago when her legs would "give way." Began doing exercises on her own to improve strength but she began noticing similar weakness last year. Reports experiencing a few stumbles but able to recover without falling. Reports stairs are challenging but she is able to perform stairs at home. Reports she can do everything but needs to be extra careful to prevent falls. Reports prior use of cane for ambulation but no longer uses unless she feels really uncomfortable walking. Reports increased challenge walking in crowded places and for increased distance. Reports recent cataract surgery earlier this year. Reports both LE's are buckling with prolonged distances 2/2 fatigue L>R knee. Reports furniture walking at home for increased balance.     Patient is accompained by:  Family member   Daughter: [name]   Pertinent History  Stroke, HTN, depression,  HLD, s/p trigger finger release 10/21/16, Reports cataract surgery (2019)    Limitations  Standing;Walking;Lifting    Patient Stated Goals  Improve her functional mobility to reduce overall near fall experiences and pain    Currently in Pain?  Yes    Pain Score  5     Pain Location  Knee   L side constant pain from knee distally; R side from hip distally. Reports great and 2nd toe are numb bilaterally but no pain    Pain Orientation  Right;Left;Distal    Pain Descriptors / Indicators  Aching;Dull    Pain Type  Chronic pain    Pain Radiating Towards  Distally     Pain Onset  More than a month ago    Pain Frequency   Constant    Aggravating Factors   walking    Pain Relieving Factors  Reports using medication to rub on her legs for symptoms relief; taking pressure off her legs and resting alleviates pain         [facility] PT Assessment - 10/27/17 1122      Assessment   Medical Diagnosis  Falls    Referring Provider (PT)  Dr. Lucianne Lei    Onset Date/Surgical Date  10/12/17    Next MD Visit  Will call Dr today to schedule follow up appt.     Prior Therapy  Reports therapy for trigger finger release in 2018      Precautions   Precautions  Fall      Restrictions   Weight Bearing Restrictions  No      Balance Screen   Has the patient fallen in the past 6 months  No   Reports multiple "stumbles" with ability to recover    Has the patient had a decrease in activity level because of a fear of falling?   No    Is the patient reluctant to leave their home because of a fear of falling?   No      Home Environment   Living Environment  Private residence    Living Arrangements  Children    Available Help at Discharge  Family    Type of [place] to enter    Entrance Stairs-Number of Steps  1    Entrance Stairs-Rails  None    Home Layout  Two level    Alternate Level Stairs-Number of Steps  15    Alternate Level Stairs-Rails  Can reach both    [place] - single point      Prior Function   Level of Independence  Independent      Cognition   Overall Cognitive Status  Within Functional Limits for tasks assessed      Observation/Other Assessments   Observations  Therapist notes mild L knee lateral swelling with no edema noted when assessed. Pt reports this is not a typical occurrance but noticed swelling earlier today.    Skin Integrity  Therapist observes bilateral skin discoloration from ankles>distally with observable darkening. Pt reports she "bleaches her feet" with no change. Pt reports she will schedule follow up visit with her vascular doctor. Pt has  observable hammer toes bilaterally with calluses to all toes. Therapist educates regarding proper foot wear with wider toe box.      Sensation   Light Touch  Appears Intact      Coordination   Gross Motor Movements are Fluid and Coordinated  No  Heel Shin Test  L heel to shin limited 2/2 knee pain restricting movement; R heel to shin limited 2/2 to fatigue & weakness      ROM / Strength   AROM / PROM / Strength  AROM;Strength      AROM   Overall AROM   Within functional limits for tasks performed      Strength   Overall Strength  Deficits    Overall Strength Comments  L LE 4/5 via MMT when grossly assessed in sitting. R LE hip flexion 4-/5, R knee extension 4/5 with all other muscle groups assessed to be WFL   R LE > L LE weakness     Transfers   Transfers  Sit to Stand;Stand to Sit;Stand Pivot Transfers    Sit to Stand  5: Supervision    Five time sit to stand comments   28.72 seconds from standard height chair with no UE assist   Indicative of high fall risk potential   Stand to Sit  5: Supervision    Stand Pivot Transfers  5: Supervision      Ambulation/Gait   Ambulation/Gait  Yes    Ambulation/Gait Assistance  5: Supervision    Ambulation Distance (Feet)  40 Feet    Assistive device  None    Gait Pattern  Step-through pattern;Decreased arm swing - right;Decreased arm swing - left;Decreased step length - right;Decreased step length - left;Decreased stride length;Decreased trunk rotation;Narrow base of support    Ambulation Surface  Level;Indoor    Gait velocity  1.8 ft/sec with no AD indicative of recurrent fall risk    Stairs  Yes    Stairs Assistance  5: Supervision    Stair Management Technique  Two rails;Step to pattern    Number of Stairs  4    Height of Stairs  6                Objective measurements completed on examination: See above findings.              PT Education - 10/27/17 1212    Education Details  Therapist provided education  regarding evaluation clinical findings, proper footwear with wider toe box for wound prevention, and POC moving forward.     Person(s) Educated  Patient;Child(ren)    Methods  Explanation    Comprehension  Verbalized understanding       PT Short Term Goals - 10/27/17 1214      PT SHORT TERM GOAL #1   Title  Pt will participate in establishment of initial HEP for improvement with strength and balance deficits    Time  4    Period  Weeks    Status  New    Target Date  11/26/17      PT SHORT TERM GOAL #2   Title  Pt will participate in [place] assessment to establish baseline fall risk potential     Time  4    Period  Weeks    Status  New    Target Date  11/26/17      PT SHORT TERM GOAL #3   Title  Pt will participate in SOT assessment with LTG as indicated     Time  4    Period  Weeks    Status  New    Target Date  11/26/17      PT SHORT TERM GOAL #4   Title  Pt will participate in 6 min walk test to establish baseline value  for endurance with LRAD     Time  4    Period  Weeks    Status  New    Target Date  11/26/17      PT SHORT TERM GOAL #5   Title  Pt will improve 5x STS time to <20 seconds from standard height chair with no UE assist indicating improvement in LE power and reduction in fall risk potential     Baseline  10/15: 28.72 seconds with no UE assist     Time  4    Period  Weeks    Status  New    Target Date  11/26/17      Additional Short Term Goals   Additional Short Term Goals  Yes      PT SHORT TERM GOAL #6   Title  Pt will improve her gait speed to >/= 2.1 ft/sec with LRAD to improve functional mobility     Baseline  10/15: 1.8 ft/sec with no AD    Time  4    Period  Weeks    Status  New    Target Date  12/26/17      PT SHORT TERM GOAL #7   Title  Pt will negotiate 8 steps with single HR demonstrating reciprocal ascending and step to descending with supervision to improve safety with home accessibility     Baseline  10/15: 4 steps using B  HR's and step to stair technique     Time  4    Period  Weeks    Status  New    Target Date  11/26/17      PT SHORT TERM GOAL #8   Title  Pt will ambulate 300' outdoors navigating uneven terrain, curbs, inclines, and demonstrating ability to vary gait speed with LRAD and therapist providing CGA to improve community ambulation    Time  4    Period  Weeks    Status  New    Target Date  11/26/17        PT Long Term Goals - 10/27/17 1219      PT LONG TERM GOAL #1   Title  Pt will report independence and compliance with HEP to improve functional mobility     Time  8    Period  Weeks    Status  New    Target Date  12/26/17      PT LONG TERM GOAL #2   Title  Pt will improve 6 min walk test by 50' indicating improvement in endurance during functional mobility     Time  8    Period  Weeks    Status  New    Target Date  12/26/17      PT LONG TERM GOAL #3   Title  Pt will improve Berg balance score by 8 points indicating reduction in fall risk potential     Time  8    Period  Weeks    Status  New    Target Date  12/26/17      PT LONG TERM GOAL #4   Title  Pt will improve 5xSTS to <15 seconds using no UE assist indicating improvement in LE power and reduction in fall risk potential    Time  8    Period  Weeks    Status  New    Target Date  12/26/17      PT LONG TERM GOAL #5   Title  Pt will negotiate 15 steps with  single HR and reciprocal stepping technique with therapist providing supervision demonstrating improvement in LE strength and functional mobility     Time  8    Period  Weeks    Status  New    Target Date  12/26/17      Additional Long Term Goals   Additional Long Term Goals  Yes      PT LONG TERM GOAL #6   Title  Pt will improve gait speed to >/= 2.40 ft/sec with LRAD indicating improvement in functional mobility and reduction in fall risk     Time  8    Period  Weeks    Status  New    Target Date  12/26/17      PT LONG TERM GOAL #7   Title  Pt will  ambulate 500' out doors navigating uneven terrain, curbs, inclines, and varying gait speed with LRAD at Mod I level to improve safety with community ambulation    Time  8    Period  Weeks    Status  New    Target Date  12/26/17             Plan - 10/27/17 1225    Clinical Impression Statement  Pt is a 83 year old female referred to Neuro OPPT for evaluation of functional mobility s/p falls.  Pt's PMH is significant for the following: Stroke, HTN, Depression, HLD, s/p trigger finger release 10/21/16, and reports cataract surgery (2019). The following deficits were noted during pt's exam: Pt demonstrates R LE weakness> L LE weakness when grossly assessed via MMT in sitting. Pt demonstrates intact sensation bilaterally and difficulty performing heel to shin coordination testing 2/2 L knee pain limited ROM and R LE weakness. Therapist notes observable darkening in skin coloration from bilateral ankles> distally. Therapist notes callus formation to dorsum of all toes which may be 2/2 to hammer toes bilaterally and improper footwear. Pt's gait speed of 1.80 ft/sec with no AD is indicative of recurrent fall risk. Pt's 5x STS time of 28.72 seconds from standard height chair with no upper extremity assist indicates she is considered a high fall risk.  Pt would benefit from skilled PT to address these impairments and functional limitations to maximize functional mobility independence and reduce falls risk.     History and Personal Factors relevant to plan of care:  Stroke, HTN, depression, HLD, s/p trigger finger release 10/21/16, Reports cataract surgery (2019). Pt reports she has difficulty performing 15 steps to get to second level of her home safely. Chronic LE pain & weakness     Clinical Presentation  Evolving    Clinical Presentation due to:  Stroke, HTN, depression, HLD, s/p trigger finger release 10/21/16, Reports cataract surgery (2019). Pt reports she has difficulty performing 15 steps to get to  second level of her home safely. Chronic LE pain & weakness     Clinical Decision Making  Moderate    Rehab Potential  Good    Clinical Impairments Affecting Rehab Potential  Chronicity of her condition; may have difficulty finding transportation- indicates she will be calling [facility]     PT Frequency  2x / week    PT Duration  8 weeks    PT Treatment/Interventions  ADLs/Self Care Home Management;DME Instruction;Balance training;Passive range of motion;Neuromuscular re-education;Gait training;Stair training;Functional mobility training;Energy conservation;Patient/family education;Therapeutic activities;Therapeutic exercise    PT Next Visit Plan  6 min walk test, berg, SOT & update goals. Establish HEP. Gait training with cane, balance. Reassess feet  for any vascular changes/ abnormalities. Ask about her appt with vascular dc for any changes. Education about shoewear with wider toe box to reduce skin break down potential     PT Home Exercise Plan  tbd    Consulted and Agree with Plan of Care  Patient;Family member/caregiver    Family Member Consulted  daughter       Patient will benefit from skilled therapeutic intervention in order to improve the following deficits and impairments:  Decreased knowledge of use of DME, Pain, Decreased coordination, Decreased mobility, Decreased activity tolerance, Decreased endurance, Decreased range of motion, Decreased strength, Decreased balance, Difficulty walking, Decreased skin integrity(Callus formation to dorsum of all toes )  Visit Diagnosis: Repeated falls  Other abnormalities of gait and mobility  Muscle weakness (generalized)  Other lack of coordination  Unsteadiness on feet     Problem List Patient Active Problem List   Diagnosis Date Noted  . Hyperlipidemia 03/31/2016  . Hypertensive heart disease 03/31/2016  . Personal history of transient ischemic attack (TIA), and cerebral infarction without residual deficits 03/31/2016    Floreen Comber, SPT 10/27/2017, 12:36 PM  [facility] [address] [place], [place], [address] Phone: [phone]   Fax:  [phone]  Name: ENSLEY BLAS MRN: [id number] Date of Birth: [birth year]

## 2017-11-06 ENCOUNTER — Encounter: Payer: Self-pay | Admitting: Physical Therapy

## 2017-11-06 ENCOUNTER — Ambulatory Visit: Payer: Medicare Other | Admitting: Physical Therapy

## 2017-11-06 DIAGNOSIS — R296 Repeated falls: Secondary | ICD-10-CM

## 2017-11-06 DIAGNOSIS — R2681 Unsteadiness on feet: Secondary | ICD-10-CM

## 2017-11-06 DIAGNOSIS — M6281 Muscle weakness (generalized): Secondary | ICD-10-CM | POA: Diagnosis not present

## 2017-11-06 DIAGNOSIS — R278 Other lack of coordination: Secondary | ICD-10-CM | POA: Diagnosis not present

## 2017-11-06 DIAGNOSIS — R2689 Other abnormalities of gait and mobility: Secondary | ICD-10-CM

## 2017-11-06 NOTE — Therapy (Signed)
[facility] [address] [place], [place], [address] Phone: [phone]   Fax:  [phone]  Physical Therapy Treatment  Patient Details  Name: Christina Austin MRN: [id number] Date of Birth: 23-Jun-1934 Referring Provider (PT): Dr. Lucianne Lei   Encounter Date: 11/06/2017  PT End of Session - 11/06/17 1327    Visit Number  2    Number of Visits  17    Date for PT Re-Evaluation  12/26/17    Authorization Type  [employer] Medicare- * requires 10th visit PN    PT Start Time  1110    PT Stop Time  1145    PT Time Calculation (min)  35 min    Activity Tolerance  Patient tolerated treatment well    Behavior During Therapy  [facility] for tasks assessed/performed       Past Medical History:  Diagnosis Date  . Anxiety   . Arthritis    legs, shoulders   . Benign essential HTN   . Chronic kidney disease, stage III (moderate) (HCC)    chronic kidney disease stage 3  . Depression   . GERD (gastroesophageal reflux disease)   . History of blood transfusion    post hysterectomy  . Hypertensive heart disease without heart failure   . Memory deficit   . Neuromuscular disorder (HCC)    intermittent facial weakness - but at times its severe enough that "I have gone to the hospital", [place] but [place] in [place]- seen by Dr. Loretta Plume  . Numbness of face   . Other and unspecified hyperlipidemia   . Plantar fasciitis   . Stroke [facility])    TIA  . Unspecified transient cerebral ischemia    takes plavix for 2ndary stroke prevention per Dr. Tomi Likens with neurology    Past Surgical History:  Procedure Laterality Date  . BILATERAL SALPINGOOPHORECTOMY    . BREAST LUMPECTOMY WITH RADIOACTIVE SEED LOCALIZATION Right 08/02/2015   Procedure: BREAST LUMPECTOMY WITH RADIOACTIVE SEED LOCALIZATION;  Surgeon: [name], MD;  Location: [place];  Service: General;  Laterality: Right;  . colonoscopy     removed polyps- to f/u in 6 months  . TOTAL ABDOMINAL  HYSTERECTOMY    . TRIGGER FINGER RELEASE Right 10/21/2016   Procedure: RELEASE TRIGGER FINGER/A-1 PULLEY RIGHT RING FINGER;  Surgeon: Daryll Brod, MD;  Location: [place];  Service: Orthopedics;  Laterality: Right;    There were no vitals filed for this visit.  Subjective Assessment - 11/06/17 1111    Subjective  Reports measuring her L knee and noticed increased swelling in L knee> R knee. Reports pain to L knee whenever she stands up and reports having to be more cautious when walking to prevent falls. Reports fear of falling 2/2 to osteopenia. Continues to have L knee buckling.     Patient is accompained by:  Family member   Daughter: [name]   Pertinent History  Stroke, HTN, depression, HLD, s/p trigger finger release 10/21/16, Reports cataract surgery (2019)    Limitations  Standing;Walking;Lifting    Patient Stated Goals  Improve her functional mobility to reduce overall near fall experiences and pain    Currently in Pain?  Yes    Pain Score  5     Pain Location  Knee    Pain Orientation  Left    Pain Descriptors / Indicators  Aching    Pain Type  Chronic pain    Pain Onset  More than a month ago  Aggravating Factors   standing from seated position     Pain Relieving Factors  Taking pressure off legs       Skilled Physical Therapy Intervention:  Sensory Organization Testing:   Conditions: 1: Patient performs initial 2 trials scoring above age related norms. However, during the 3rd attempt patient scores slightly below her age related norm.  2: Patient scores all 3 attempts above her age related norm. 3: Patient scores 2 attempts well below her age related norm with x1 fall 2/2 posterior LOB with inability to demonstrate hip strategy.   4: Patient scores 2 attempts below her age related norm with one attempt being scored as a fall.   5: Patient is scored as a fall for all three attempts 2/2 posterior LOB with inability to demonstrate hip strategy.  6: 5:  Patient is scored as a fall for all three attempts 2/2 posterior LOB with inability to demonstrate hip strategy.  Composite score: 30 out of 65 Sensory Analysis Som: 100 Vis: 55 Vest: 0 Pref: 52 Strategy analysis: Patient demonstrates ankle strategy for all 6 conditions with inability to demonstrate hip strategy for posterior LOB recovery.  COG alignment: Patient demonstrates COG alignment posterior and to her left    [facility] Adult PT Treatment/Exercise - 11/06/17 1323      Therapeutic Activites    Therapeutic Activities  Other Therapeutic Activities    Other Therapeutic Activities  Therapist provided education regarding clinical findings from sensory organization testing. Therapist provides education about 3 primary balance strategies utilized and when each strategy is appropriate to recover balance safely. Pt verbalizes understanding that based on the results she may be having posterior LOB episodes 2/2 to vestibular hypofunctioning with heavy reliance on her visual system for balance. Subsequently, therapist provided extensive education for patient to discontinue bleaching her feet 2/2 the negative side effects of harsh chemicals on skin. Pt verbalizes that she will no longer bleach her feet.             PT Education - 11/06/17 1326    Education Details  Therapist provided education regarding clinical findings of SOT assessment and for patient to discontinue bleaching her feet 2/2 to complications that may arise from harsh chemicals coming into contact with skin.    Person(s) Educated  Patient    Methods  Explanation    Comprehension  Verbalized understanding       PT Short Term Goals - 11/06/17 1332      PT SHORT TERM GOAL #1   Title  Pt will participate in establishment of initial HEP for improvement with strength and balance deficits    Time  4    Period  Weeks    Status  New      PT SHORT TERM GOAL #2   Title  Pt will participate in [place] assessment to establish  baseline fall risk potential     Time  4    Period  Weeks    Status  New      PT SHORT TERM GOAL #3   Title  Pt will participate in SOT assessment with LTG as indicated     Baseline  10/25; Composite score of 30/65 for age related norm (79)    Time  4    Period  Weeks    Status  Achieved      PT SHORT TERM GOAL #4   Title  Pt will participate in 6 min walk test to establish baseline value for endurance with  LRAD     Time  4    Period  Weeks    Status  New      PT SHORT TERM GOAL #5   Title  Pt will improve 5x STS time to <20 seconds from standard height chair with no UE assist indicating improvement in LE power and reduction in fall risk potential     Baseline  10/15: 28.72 seconds with no UE assist     Time  4    Period  Weeks    Status  New      PT SHORT TERM GOAL #6   Title  Pt will improve her gait speed to >/= 2.1 ft/sec with LRAD to improve functional mobility     Baseline  10/15: 1.8 ft/sec with no AD    Time  4    Period  Weeks    Status  New      PT SHORT TERM GOAL #7   Title  Pt will negotiate 8 steps with single HR demonstrating reciprocal ascending and step to descending with supervision to improve safety with home accessibility     Baseline  10/15: 4 steps using B HR's and step to stair technique     Time  4    Period  Weeks    Status  New      PT SHORT TERM GOAL #8   Title  Pt will ambulate 300' outdoors navigating uneven terrain, curbs, inclines, and demonstrating ability to vary gait speed with LRAD and therapist providing CGA to improve community ambulation    Time  4    Period  Weeks    Status  New        PT Long Term Goals - 11/06/17 1332      PT LONG TERM GOAL #1   Title  Pt will report independence and compliance with HEP to improve functional mobility     Time  8    Period  Weeks    Status  New      PT LONG TERM GOAL #2   Title  Pt will improve 6 min walk test by 50' indicating improvement in endurance during functional mobility     Time   8    Period  Weeks    Status  New      PT LONG TERM GOAL #3   Title  Pt will improve Berg balance score by 8 points indicating reduction in fall risk potential     Time  8    Period  Weeks    Status  New      PT LONG TERM GOAL #4   Title  Pt will improve 5xSTS to <15 seconds using no UE assist indicating improvement in LE power and reduction in fall risk potential    Time  8    Period  Weeks    Status  New      PT LONG TERM GOAL #5   Title  Pt will negotiate 15 steps with single HR and reciprocal stepping technique with therapist providing supervision demonstrating improvement in LE strength and functional mobility     Time  8    Period  Weeks    Status  New      PT LONG TERM GOAL #6   Title  Pt will improve gait speed to >/= 2.40 ft/sec with LRAD indicating improvement in functional mobility and reduction in fall risk     Time  8    Period  Weeks    Status  New      PT LONG TERM GOAL #7   Title  Pt will ambulate 500' out doors navigating uneven terrain, curbs, inclines, and varying gait speed with LRAD at Mod I level to improve safety with community ambulation    Time  8    Period  Weeks    Status  New      PT LONG TERM GOAL #8   Title  Pt will improve SOT composite score to meet age related norms to reduce fall risk potential     Baseline  10/25: 30/65 composite score     Status  New            Plan - 11/06/17 1328    Clinical Impression Statement  Today's skilled session primarily focused on sensory organization testing to assess patient's utilization of balance strategies under different conditions. Based on the results, patient demonstrates increased challenge performing conditions 3, 4, 5, & 6 indicated by multiple falls experienced under these conditions requiring therapist to provide min A for steadying. Overall, patient has a composite score of 30 out of 65 when compared to age related norms. Patient relies heavily on her somatosensory system to maintain  balance and demonstrates little activation of her vestibular system to maintain balance. Patient demonstrates heavy utilization of ankle strategy to recover balance with inability to demonstrate hip strategy under any of the conditions. Based on these findings, patient would benefit from skilled PT to address dynamic balance and implementation of balance strategies into therapy sessions to improve safety with functional mobility.     Rehab Potential  Good    Clinical Impairments Affecting Rehab Potential  Chronicity of her condition; may have difficulty finding transportation- indicates she will be calling [facility]     PT Frequency  2x / week    PT Duration  8 weeks    PT Treatment/Interventions  ADLs/Self Care Home Management;DME Instruction;Balance training;Passive range of motion;Neuromuscular re-education;Gait training;Stair training;Functional mobility training;Energy conservation;Patient/family education;Therapeutic activities;Therapeutic exercise    PT Next Visit Plan  6 min walk test, berg,Establish HEP. BALANCE STRATEGIES HIP AND STEP. Gait training with cane, balance. Reassess feet for any vascular changes/ abnormalities. Ask about her appt with vascular dc for any changes. Education about shoewear with wider toe box to reduce skin break down potential     PT Home Exercise Plan  tbd    Consulted and Agree with Plan of Care  Patient       Patient will benefit from skilled therapeutic intervention in order to improve the following deficits and impairments:  Decreased knowledge of use of DME, Pain, Decreased coordination, Decreased mobility, Decreased activity tolerance, Decreased endurance, Decreased range of motion, Decreased strength, Decreased balance, Difficulty walking, Decreased skin integrity(Callus formation to dorsum of all toes )  Visit Diagnosis: Repeated falls  Unsteadiness on feet  Other abnormalities of gait and mobility     Problem List Patient Active Problem List    Diagnosis Date Noted  . Hyperlipidemia 03/31/2016  . Hypertensive heart disease 03/31/2016  . Personal history of transient ischemic attack (TIA), and cerebral infarction without residual deficits 03/31/2016    Floreen Comber, SPT 11/06/2017, 1:35 PM  [facility] [address] [place], [place], [address] Phone: [phone]   Fax:  [phone]  Name: Christina Austin MRN: [id number] Date of Birth: 24-Sep-1934

## 2017-11-11 ENCOUNTER — Encounter: Payer: Self-pay | Admitting: Physical Therapy

## 2017-11-11 ENCOUNTER — Ambulatory Visit: Payer: Medicare Other | Admitting: Physical Therapy

## 2017-11-11 DIAGNOSIS — R2689 Other abnormalities of gait and mobility: Secondary | ICD-10-CM

## 2017-11-11 DIAGNOSIS — R296 Repeated falls: Secondary | ICD-10-CM | POA: Diagnosis not present

## 2017-11-11 DIAGNOSIS — M6281 Muscle weakness (generalized): Secondary | ICD-10-CM | POA: Diagnosis not present

## 2017-11-11 DIAGNOSIS — R278 Other lack of coordination: Secondary | ICD-10-CM

## 2017-11-11 DIAGNOSIS — R2681 Unsteadiness on feet: Secondary | ICD-10-CM

## 2017-11-11 NOTE — Therapy (Signed)
[facility] [address] [place], [place], [address] Phone: [phone]   Fax:  [phone]  Physical Therapy Treatment  Patient Details  Name: Christina Austin MRN: [id number] Date of Birth: 06-Nov-1934 Referring Provider (PT): Dr. Lucianne Lei   Encounter Date: 11/11/2017  PT End of Session - 11/11/17 0939    Visit Number  3    Number of Visits  17    Date for PT Re-Evaluation  12/26/17    Authorization Type  [employer] Medicare- * requires 10th visit PN    PT Start Time  0851    PT Stop Time  0931    PT Time Calculation (min)  40 min    Activity Tolerance  Patient tolerated treatment well    Behavior During Therapy  [facility] for tasks assessed/performed       Past Medical History:  Diagnosis Date  . Anxiety   . Arthritis    legs, shoulders   . Benign essential HTN   . Chronic kidney disease, stage III (moderate) (HCC)    chronic kidney disease stage 3  . Depression   . GERD (gastroesophageal reflux disease)   . History of blood transfusion    post hysterectomy  . Hypertensive heart disease without heart failure   . Memory deficit   . Neuromuscular disorder (HCC)    intermittent facial weakness - but at times its severe enough that "I have gone to the hospital", out of state but here in [place]- seen by Dr. Loretta Plume  . Numbness of face   . Other and unspecified hyperlipidemia   . Plantar fasciitis   . Stroke [facility])    TIA  . Unspecified transient cerebral ischemia    takes plavix for 2ndary stroke prevention per Dr. Tomi Likens with neurology    Past Surgical History:  Procedure Laterality Date  . BILATERAL SALPINGOOPHORECTOMY    . BREAST LUMPECTOMY WITH RADIOACTIVE SEED LOCALIZATION Right 08/02/2015   Procedure: BREAST LUMPECTOMY WITH RADIOACTIVE SEED LOCALIZATION;  Surgeon: [name], MD;  Location: [place];  Service: General;  Laterality: Right;  . colonoscopy     removed polyps- to f/u in 6 months  . TOTAL ABDOMINAL  HYSTERECTOMY    . TRIGGER FINGER RELEASE Right 10/21/2016   Procedure: RELEASE TRIGGER FINGER/A-1 PULLEY RIGHT RING FINGER;  Surgeon: Daryll Brod, MD;  Location: [place];  Service: Orthopedics;  Laterality: Right;    There were no vitals filed for this visit.  Subjective Assessment - 11/11/17 0937    Subjective  doing well - no issues, has stopped bleaching feet    Pertinent History  Stroke, HTN, depression, HLD, s/p trigger finger release 10/21/16, Reports cataract surgery (2019)    Limitations  Standing;Walking;Lifting    Patient Stated Goals  Improve her functional mobility to reduce overall near fall experiences and pain    Currently in Pain?  No/denies    Pain Score  0-No pain    Pain Onset  More than a month ago         [facility] PT Assessment - 11/11/17 0904      6 minute walk test results    Aerobic Endurance Distance Walked  823    Endurance additional comments  30 sec seated rest break      Balance   Balance Assessed  Yes      Standardized Balance Assessment   Standardized Balance Assessment  Berg Balance Test      Berg Balance Test  Sit to Stand  Able to stand without using hands and stabilize independently    Standing Unsupported  Able to stand safely 2 minutes    Sitting with Back Unsupported but Feet Supported on Floor or Stool  Able to sit safely and securely 2 minutes    Stand to Sit  Sits safely with minimal use of hands    Transfers  Able to transfer safely, minor use of hands    Standing Unsupported with Eyes Closed  Able to stand 3 seconds    Standing Ubsupported with Feet Together  Able to place feet together independently and stand 1 minute safely    From Standing, Reach Forward with Outstretched Arm  Can reach forward >12 cm safely (5")    From Standing Position, Pick up Object from Floor  Able to pick up shoe safely and easily    From Standing Position, Turn to Look Behind Over each Shoulder  Looks behind from both sides and weight shifts  well    Turn 360 Degrees  Able to turn 360 degrees safely in 4 seconds or less    Standing Unsupported, Alternately Place Feet on Step/Stool  Able to complete >2 steps/needs minimal assist   requires supervision   Standing Unsupported, One Foot in Front  Able to plae foot ahead of the other independently and hold 30 seconds    Standing on One Leg  Able to lift leg independently and hold equal to or more than 3 seconds    Total Score  47    Berg comment:  47/56 - moderate fall risk                        Balance Exercises - 11/11/17 0935      Balance Exercises: Standing   Standing Eyes Opened  Narrow base of support (BOS);Foam/compliant surface;Solid surface;Head turns;1 rep;30 secs   firm surface, 2 pillows, firm with H and V head turns   Standing Eyes Closed  Narrow base of support (BOS);1 rep;30 secs   progression to compliant with EC x 1   Tandem Stance  Eyes open   1/2 tandem throughout: H and V head turns       PT Education - 11/11/17 0938    Education Details  education on stopping bleaching of feet; patient states she now plans to soak her feet. PT discussing need to test water temp as well as not saok for prolonged periods to reduce burns/skin breakdown; balance HEP    Person(s) Educated  Patient    Methods  Explanation;Demonstration;Handout    Comprehension  Verbalized understanding;Returned demonstration       PT Short Term Goals - 11/06/17 1332      PT SHORT TERM GOAL #1   Title  Pt will participate in establishment of initial HEP for improvement with strength and balance deficits    Time  4    Period  Weeks    Status  New      PT SHORT TERM GOAL #2   Title  Pt will participate in [place] assessment to establish baseline fall risk potential     Time  4    Period  Weeks    Status  New      PT SHORT TERM GOAL #3   Title  Pt will participate in SOT assessment with LTG as indicated     Baseline  10/25; Composite score of 30/65 for age  related norm (79)  Time  4    Period  Weeks    Status  Achieved      PT SHORT TERM GOAL #4   Title  Pt will participate in 6 min walk test to establish baseline value for endurance with LRAD     Time  4    Period  Weeks    Status  New      PT SHORT TERM GOAL #5   Title  Pt will improve 5x STS time to <20 seconds from standard height chair with no UE assist indicating improvement in LE power and reduction in fall risk potential     Baseline  10/15: 28.72 seconds with no UE assist     Time  4    Period  Weeks    Status  New      PT SHORT TERM GOAL #6   Title  Pt will improve her gait speed to >/= 2.1 ft/sec with LRAD to improve functional mobility     Baseline  10/15: 1.8 ft/sec with no AD    Time  4    Period  Weeks    Status  New      PT SHORT TERM GOAL #7   Title  Pt will negotiate 8 steps with single HR demonstrating reciprocal ascending and step to descending with supervision to improve safety with home accessibility     Baseline  10/15: 4 steps using B HR's and step to stair technique     Time  4    Period  Weeks    Status  New      PT SHORT TERM GOAL #8   Title  Pt will ambulate 300' outdoors navigating uneven terrain, curbs, inclines, and demonstrating ability to vary gait speed with LRAD and therapist providing CGA to improve community ambulation    Time  4    Period  Weeks    Status  New        PT Long Term Goals - 11/06/17 1332      PT LONG TERM GOAL #1   Title  Pt will report independence and compliance with HEP to improve functional mobility     Time  8    Period  Weeks    Status  New      PT LONG TERM GOAL #2   Title  Pt will improve 6 min walk test by 50' indicating improvement in endurance during functional mobility     Time  8    Period  Weeks    Status  New      PT LONG TERM GOAL #3   Title  Pt will improve Berg balance score by 8 points indicating reduction in fall risk potential     Time  8    Period  Weeks    Status  New      PT LONG  TERM GOAL #4   Title  Pt will improve 5xSTS to <15 seconds using no UE assist indicating improvement in LE power and reduction in fall risk potential    Time  8    Period  Weeks    Status  New      PT LONG TERM GOAL #5   Title  Pt will negotiate 15 steps with single HR and reciprocal stepping technique with therapist providing supervision demonstrating improvement in LE strength and functional mobility     Time  8    Period  Weeks    Status  New  PT LONG TERM GOAL #6   Title  Pt will improve gait speed to >/= 2.40 ft/sec with LRAD indicating improvement in functional mobility and reduction in fall risk     Time  8    Period  Weeks    Status  New      PT LONG TERM GOAL #7   Title  Pt will ambulate 500' out doors navigating uneven terrain, curbs, inclines, and varying gait speed with LRAD at Mod I level to improve safety with community ambulation    Time  8    Period  Weeks    Status  New      PT LONG TERM GOAL #8   Title  Pt will improve SOT composite score to meet age related norms to reduce fall risk potential     Baseline  10/25: 30/65 composite score     Status  New            Plan - 11/11/17 0939    Clinical Impression Statement  Today assessing 6MWT with patient ambulating 823 feet total but did require 1 seated rest break. Berg assessment with patient scoring 47/56 demonstrating moderate fall risk. Patient with some inconsistencies with gait with carying speeds and patient report of typical slower gait speed. Noted L rotation at trunk with posturing and balance activities. Initated HEP today with good tolerance and carryover.     Rehab Potential  Good    Clinical Impairments Affecting Rehab Potential  Chronicity of her condition; may have difficulty finding transportation- indicates she will be calling [facility]     PT Frequency  2x / week    PT Duration  8 weeks    PT Treatment/Interventions  ADLs/Self Care Home Management;DME Instruction;Balance training;Passive range  of motion;Neuromuscular re-education;Gait training;Stair training;Functional mobility training;Energy conservation;Patient/family education;Therapeutic activities;Therapeutic exercise    PT Next Visit Plan  BALANCE STRATEGIES HIP AND STEP. Gait training with cane, balance. Reassess feet for any vascular changes/ abnormalities. Ask about her appt with vascular dc for any changes. Education about shoewear with wider toe box to reduce skin break down potential; challenge balance with vestibular input    PT Home Exercise Plan  [id number]    Consulted and Agree with Plan of Care  Patient       Patient will benefit from skilled therapeutic intervention in order to improve the following deficits and impairments:  Decreased knowledge of use of DME, Pain, Decreased coordination, Decreased mobility, Decreased activity tolerance, Decreased endurance, Decreased range of motion, Decreased strength, Decreased balance, Difficulty walking, Decreased skin integrity(Callus formation to dorsum of all toes )  Visit Diagnosis: Repeated falls  Unsteadiness on feet  Other abnormalities of gait and mobility  Muscle weakness (generalized)  Other lack of coordination     Problem List Patient Active Problem List   Diagnosis Date Noted  . Hyperlipidemia 03/31/2016  . Hypertensive heart disease 03/31/2016  . Personal history of transient ischemic attack (TIA), and cerebral infarction without residual deficits 03/31/2016     Lanney Gins, PT, DPT Supplemental Physical Therapist 11/11/17 9:42 AM Pager: [phone] Office: [facility] [address] [place], [place], [address] Phone: [phone]   Fax:  [phone]  Name: Christina Austin MRN: [id number] Date of Birth: March 07, 1934

## 2017-11-17 ENCOUNTER — Encounter: Payer: Self-pay | Admitting: Physical Therapy

## 2017-11-17 ENCOUNTER — Ambulatory Visit: Payer: Medicare Other | Attending: Family Medicine | Admitting: Physical Therapy

## 2017-11-17 DIAGNOSIS — R2681 Unsteadiness on feet: Secondary | ICD-10-CM | POA: Insufficient documentation

## 2017-11-17 DIAGNOSIS — M6281 Muscle weakness (generalized): Secondary | ICD-10-CM | POA: Insufficient documentation

## 2017-11-17 DIAGNOSIS — R2689 Other abnormalities of gait and mobility: Secondary | ICD-10-CM

## 2017-11-17 NOTE — Therapy (Signed)
[facility] [address] [place], [place], [address] Phone: [phone]   Fax:  [phone]  Physical Therapy Treatment  Patient Details  Name: Christina Austin MRN: [id number] Date of Birth: 06/26/1934 Referring Provider (PT): Dr. Lucianne Lei   Encounter Date: 11/17/2017  PT End of Session - 11/17/17 1248    Visit Number  4    Number of Visits  17    Date for PT Re-Evaluation  12/26/17    Authorization Type  [employer] Medicare- * requires 10th visit PN    PT Start Time  1020    PT Stop Time  1100    PT Time Calculation (min)  40 min    Equipment Utilized During Treatment  Gait belt    Activity Tolerance  Patient tolerated treatment well    Behavior During Therapy  WFL for tasks assessed/performed       Past Medical History:  Diagnosis Date  . Anxiety   . Arthritis    legs, shoulders   . Benign essential HTN   . Chronic kidney disease, stage III (moderate) (HCC)    chronic kidney disease stage 3  . Depression   . GERD (gastroesophageal reflux disease)   . History of blood transfusion    post hysterectomy  . Hypertensive heart disease without heart failure   . Memory deficit   . Neuromuscular disorder (HCC)    intermittent facial weakness - but at times its severe enough that "I have gone to the hospital", [place] state but here in [place]- seen by Dr. Loretta Plume  . Numbness of face   . Other and unspecified hyperlipidemia   . Plantar fasciitis   . Stroke [facility])    TIA  . Unspecified transient cerebral ischemia    takes plavix for 2ndary stroke prevention per Dr. Tomi Likens with neurology    Past Surgical History:  Procedure Laterality Date  . BILATERAL SALPINGOOPHORECTOMY    . BREAST LUMPECTOMY WITH RADIOACTIVE SEED LOCALIZATION Right 08/02/2015   Procedure: BREAST LUMPECTOMY WITH RADIOACTIVE SEED LOCALIZATION;  Surgeon: [name], MD;  Location: [facility];  Service: General;  Laterality: Right;  . colonoscopy      removed polyps- to f/u in 6 months  . TOTAL ABDOMINAL HYSTERECTOMY    . TRIGGER FINGER RELEASE Right 10/21/2016   Procedure: RELEASE TRIGGER FINGER/A-1 PULLEY RIGHT RING FINGER;  Surgeon: Daryll Brod, MD;  Location: [facility];  Service: Orthopedics;  Laterality: Right;    There were no vitals filed for this visit.  Subjective Assessment - 11/17/17 1023    Subjective  Reports everything is going well. Reports experiencing "stumbles" 2/2 to feet getting caught underneath her. Reports she was able to recover her balance by "leaning backwards" to prevent subsequent falls. Reports she is performing her corner balance exercises at home and has increased difficulty with standing on a pillow while balancing.     Patient is accompained by:  Family member   Daughter: Christina Austin   Pertinent History  Stroke, HTN, depression, HLD, s/p trigger finger release 10/21/16, Reports cataract surgery (2019)    Limitations  Standing;Walking;Lifting    Patient Stated Goals  Improve her functional mobility to reduce overall near fall experiences and pain    Currently in Pain?  Yes    Pain Score  5     Pain Location  Leg    Pain Orientation  Right;Left    Pain Descriptors / Indicators  Aching    Pain Type  Chronic pain    Pain Onset  More than a month ago    Pain Frequency  Constant    Aggravating Factors   standing from a seated position    Pain Relieving Factors  taking pressure off her legs        [facility] Adult PT Treatment/Exercise - 11/17/17 1243      Balance   Balance Assessed  Yes      Dynamic Standing Balance   Rocker board comments:  Pt performs lateral weight shifting on the rocker board in the // bars. Pt progresses to weightshifting laterally while performing horizontal head turns. Pt further progresses to eyes closed demonstrating significant increased sway during 30 sec duration      Therapeutic Activites    Therapeutic Activities  Other Therapeutic Activities    Other Therapeutic  Activities  Pt performs alternating forward and backwards stepping off foam beam with UE assist on // bars progressing to no UE assist to begin implementing balance strategy practice into treatment sessions. Therapist provides education regarding appropriate utilization of ankle, hip, and stepping strategies to recover balance during LOB episodes. Therapist progresses patient to performing balance strategies with therapist providing spontanteous external pertubations. Pt requires cueing for correct timing and utilization of strategies 2/2 to strong preference towards ankle strategy and UE assist on // bars without initiating stepping strategy. Therapist provided min guard throughout duration with patient demonstrating ability to correctly perform and choose strategy appropriately with increased practice repetitions.              PT Education - 11/17/17 1242    Education Details  Therapist provided education regrading appropriate utilization of balance strategies to prevent falls and to perform forward/ backward stepping at home to further progress towards implementation of stepping strategy into everyday practice. Therapist educates patient to follow up with primary physician for further questions regarding bilateral ankle skin discoloration.      Person(s) Educated  Patient    Methods  Explanation    Comprehension  Verbalized understanding;Returned demonstration       PT Short Term Goals - 11/06/17 1332      PT SHORT TERM GOAL #1   Title  Pt will participate in establishment of initial HEP for improvement with strength and balance deficits    Time  4    Period  Weeks    Status  New      PT SHORT TERM GOAL #2   Title  Pt will participate in [place] assessment to establish baseline fall risk potential     Time  4    Period  Weeks    Status  New      PT SHORT TERM GOAL #3   Title  Pt will participate in SOT assessment with LTG as indicated     Baseline  10/25; Composite score of  30/65 for age related norm (81)    Time  4    Period  Weeks    Status  Achieved      PT SHORT TERM GOAL #4   Title  Pt will participate in 6 min walk test to establish baseline value for endurance with LRAD     Time  4    Period  Weeks    Status  New      PT SHORT TERM GOAL #5   Title  Pt will improve 5x STS time to <20 seconds from standard height chair with no UE assist indicating improvement in LE power and  reduction in fall risk potential     Baseline  10/15: 28.72 seconds with no UE assist     Time  4    Period  Weeks    Status  New      PT SHORT TERM GOAL #6   Title  Pt will improve her gait speed to >/= 2.1 ft/sec with LRAD to improve functional mobility     Baseline  10/15: 1.8 ft/sec with no AD    Time  4    Period  Weeks    Status  New      PT SHORT TERM GOAL #7   Title  Pt will negotiate 8 steps with single HR demonstrating reciprocal ascending and step to descending with supervision to improve safety with home accessibility     Baseline  10/15: 4 steps using B HR's and step to stair technique     Time  4    Period  Weeks    Status  New      PT SHORT TERM GOAL #8   Title  Pt will ambulate 300' outdoors navigating uneven terrain, curbs, inclines, and demonstrating ability to vary gait speed with LRAD and therapist providing CGA to improve community ambulation    Time  4    Period  Weeks    Status  New        PT Long Term Goals - 11/06/17 1332      PT LONG TERM GOAL #1   Title  Pt will report independence and compliance with HEP to improve functional mobility     Time  8    Period  Weeks    Status  New      PT LONG TERM GOAL #2   Title  Pt will improve 6 min walk test by 50' indicating improvement in endurance during functional mobility     Time  8    Period  Weeks    Status  New      PT LONG TERM GOAL #3   Title  Pt will improve Berg balance score by 8 points indicating reduction in fall risk potential     Time  8    Period  Weeks    Status  New       PT LONG TERM GOAL #4   Title  Pt will improve 5xSTS to <15 seconds using no UE assist indicating improvement in LE power and reduction in fall risk potential    Time  8    Period  Weeks    Status  New      PT LONG TERM GOAL #5   Title  Pt will negotiate 15 steps with single HR and reciprocal stepping technique with therapist providing supervision demonstrating improvement in LE strength and functional mobility     Time  8    Period  Weeks    Status  New      PT LONG TERM GOAL #6   Title  Pt will improve gait speed to >/= 2.40 ft/sec with LRAD indicating improvement in functional mobility and reduction in fall risk     Time  8    Period  Weeks    Status  New      PT LONG TERM GOAL #7   Title  Pt will ambulate 500' out doors navigating uneven terrain, curbs, inclines, and varying gait speed with LRAD at Mod I level to improve safety with community ambulation    Time  8  Period  Weeks    Status  New      PT LONG TERM GOAL #8   Title  Pt will improve SOT composite score to meet age related norms to reduce fall risk potential     Baseline  10/25: 30/65 composite score     Status  New            Plan - 11/17/17 1248    Clinical Impression Statement  Skilled session focused on implementation of balance strategy practice into treatment session. Pt tolerated session well and is beginning to correctly utilize different balance strategies at appropriate times. Pt continues to demonstrate strong preference for ankle strategy utilization to maintain upright posture against external pertubations. Pt will continue to benefit from further reinforcement of stepping strategy utilization to increase safety during functional mobility.     Rehab Potential  Good    Clinical Impairments Affecting Rehab Potential  Chronicity of her condition; may have difficulty finding transportation- indicates she will be calling [facility]     PT Frequency  2x / week    PT Duration  8 weeks    PT  Treatment/Interventions  ADLs/Self Care Home Management;DME Instruction;Balance training;Passive range of motion;Neuromuscular re-education;Gait training;Stair training;Functional mobility training;Energy conservation;Patient/family education;Therapeutic activities;Therapeutic exercise    PT Next Visit Plan  BALANCE STRATEGIES HIP AND STEP. rocker board with cone reaching for hip strategy, cone tapping, resisted gait, Gait training with cane, balance. foam beam tandem, hurdles, ball toss, bosu ball, Ask about her appt with vascular dc for any changes. challenge balance with vestibular input    PT Home Exercise Plan  [id number]    Consulted and Agree with Plan of Care  Patient       Patient will benefit from skilled therapeutic intervention in order to improve the following deficits and impairments:  Decreased knowledge of use of DME, Pain, Decreased coordination, Decreased mobility, Decreased activity tolerance, Decreased endurance, Decreased range of motion, Decreased strength, Decreased balance, Difficulty walking, Decreased skin integrity(Callus formation to dorsum of all toes )  Visit Diagnosis: Unsteadiness on feet  Other abnormalities of gait and mobility     Problem List Patient Active Problem List   Diagnosis Date Noted  . Hyperlipidemia 03/31/2016  . Hypertensive heart disease 03/31/2016  . Personal history of transient ischemic attack (TIA), and cerebral infarction without residual deficits 03/31/2016    Floreen Comber, SPT 11/17/2017, 12:54 PM  [facility] [address] [place], [place], [address] Phone: [phone]   Fax:  [phone]  Name: Christina Austin MRN: [id number] Date of Birth: August 25, 1934

## 2017-11-19 ENCOUNTER — Emergency Department (HOSPITAL_COMMUNITY)
Admission: EM | Admit: 2017-11-19 | Discharge: 2017-11-19 | Disposition: A | Discharge status: Home / Self Care | Payer: Medicare Other | Attending: Emergency Medicine | Admitting: Emergency Medicine

## 2017-11-19 ENCOUNTER — Other Ambulatory Visit: Payer: Self-pay

## 2017-11-19 DIAGNOSIS — Z79899 Other long term (current) drug therapy: Secondary | ICD-10-CM | POA: Diagnosis not present

## 2017-11-19 DIAGNOSIS — K5900 Constipation, unspecified: Secondary | ICD-10-CM | POA: Insufficient documentation

## 2017-11-19 DIAGNOSIS — I129 Hypertensive chronic kidney disease with stage 1 through stage 4 chronic kidney disease, or unspecified chronic kidney disease: Secondary | ICD-10-CM | POA: Insufficient documentation

## 2017-11-19 DIAGNOSIS — N183 Chronic kidney disease, stage 3 (moderate): Secondary | ICD-10-CM | POA: Insufficient documentation

## 2017-11-19 MED ORDER — MINERAL OIL RE ENEM
1.0000 | ENEMA | Freq: Once | RECTAL | Status: AC
  Administered 2017-11-19: 1 via RECTAL
  Filled 2017-11-19 (×2): qty 1

## 2017-11-19 NOTE — ED Notes (Signed)
PT was able to have several BMs, feels better. Will get dressed

## 2017-11-19 NOTE — ED Triage Notes (Signed)
Patient presents to the ED with C/O constipation.  States that her last bowel movement was last week.  States that she has been taking mirilax without effect.

## 2017-11-19 NOTE — ED Provider Notes (Signed)
  Face-to-face evaluation   History: She presents for evaluation constipation for a week.  She has intermittent bouts of constipation.  She has been on a clear liquid diet to improve stooling.   Physical exam: Alert elderly patient was comfortable.  Abdomen nontender to palpation.   Medical screening examination/treatment/procedure(s) were conducted as a shared visit with non-physician practitioner(s) and myself.  I personally evaluated the patient during the encounter   Daleen Bo, MD 11/20/17 1358

## 2017-11-19 NOTE — ED Provider Notes (Signed)
[facility] EMERGENCY DEPARTMENT Provider Note   CSN: [id number] Arrival date & time: 11/19/17  1150     History   Chief Complaint Chief Complaint  Patient presents with  . Constipation    HPI Christina Austin is a 83 y.o. female.  HPI    83 year old female presents today with complaints of constipation.  Patient notes that she regularly takes MiraLAX to stay regular.  She notes that over the last week she has not been using this.  She notes her last bowel movements approximately 1 week ago, she reports she is passing gas.  She denies any associated nausea vomiting fever or abdominal pain.  She denies any new medications, no narcotic medication.   Past Medical History:  Diagnosis Date  . Anxiety   . Arthritis    legs, shoulders   . Benign essential HTN   . Chronic kidney disease, stage III (moderate) (HCC)    chronic kidney disease stage 3  . Depression   . GERD (gastroesophageal reflux disease)   . History of blood transfusion    post hysterectomy  . Hypertensive heart disease without heart failure   . Memory deficit   . Neuromuscular disorder (HCC)    intermittent facial weakness - but at times its severe enough that "I have gone to the hospital", [place] of state but here in [place]- seen by Dr. Loretta Plume  . Numbness of face   . Other and unspecified hyperlipidemia   . Plantar fasciitis   . Stroke [facility])    TIA  . Unspecified transient cerebral ischemia    takes plavix for 2ndary stroke prevention per Dr. Tomi Likens with neurology    Patient Active Problem List   Diagnosis Date Noted  . Hyperlipidemia 03/31/2016  . Hypertensive heart disease 03/31/2016  . Personal history of transient ischemic attack (TIA), and cerebral infarction without residual deficits 03/31/2016    Past Surgical History:  Procedure Laterality Date  . BILATERAL SALPINGOOPHORECTOMY    . BREAST LUMPECTOMY WITH RADIOACTIVE SEED LOCALIZATION Right 08/02/2015   Procedure: BREAST LUMPECTOMY  WITH RADIOACTIVE SEED LOCALIZATION;  Surgeon: [name], MD;  Location: [facility];  Service: General;  Laterality: Right;  . colonoscopy     removed polyps- to f/u in 6 months  . TOTAL ABDOMINAL HYSTERECTOMY    . TRIGGER FINGER RELEASE Right 10/21/2016   Procedure: RELEASE TRIGGER FINGER/A-1 PULLEY RIGHT RING FINGER;  Surgeon: Daryll Brod, MD;  Location: [facility];  Service: Orthopedics;  Laterality: Right;     OB History   None      Home Medications    Prior to Admission medications   Medication Sig Start Date End Date Taking? Authorizing Provider  amLODipine (NORVASC) 10 MG tablet Take 10 mg by mouth every evening.     [provider]  atorvastatin (LIPITOR) 40 MG tablet Take 20 mg by mouth every evening.    [provider]  buPROPion (WELLBUTRIN XL) 300 MG 24 hr tablet Take 300 mg by mouth every morning.    [provider]  clonazePAM (KLONOPIN) 0.5 MG tablet Take 0.5 mg by mouth daily.    [provider]  clopidogrel (PLAVIX) 75 MG tablet Take 75 mg by mouth daily.    [provider]  hydrochlorothiazide (HYDRODIURIL) 25 MG tablet Take 25 mg by mouth every evening.     [provider]  mirabegron ER (MYRBETRIQ) 25 MG TB24 tablet Take 25 mg by mouth daily.    [provider]  Multiple Vitamin (MULTI VITAMIN PO) Take 1 tablet by mouth daily.    [provider]  omeprazole (PRILOSEC) 20 MG capsule Take 20 mg by mouth daily as needed (heartburn).    [provider]  polyethylene glycol (MIRALAX / GLYCOLAX) packet Take 17 g by mouth daily.    [provider]  traMADol (ULTRAM) 50 MG tablet Take 1 tablet (50 mg total) by mouth every 6 (six) hours as needed. 10/21/16   Daryll Brod, MD  vitamin B-12 (CYANOCOBALAMIN) 100 MCG tablet Take 100 mcg by mouth daily.    [provider]    Family History Family History  Problem Relation Age of Onset  . Cancer Mother         panceratic    Social History Social History   Tobacco Use  . Smoking status: Never Smoker  . Smokeless tobacco: Never Used  Substance Use Topics  . Alcohol use: No  . Drug use: No     Allergies   Aspirin   Review of Systems Review of Systems  All other systems reviewed and are negative.    Physical Exam Updated Vital Signs BP 131/80   Pulse 84   Temp 98.7 F (37.1 C) (Oral)   Resp 18   SpO2 100%   Physical Exam  Constitutional: She is oriented to person, place, and time. She appears well-developed and well-nourished.  HENT:  Head: Normocephalic and atraumatic.  Eyes: Pupils are equal, round, and reactive to light. Conjunctivae are normal. Right eye exhibits no discharge. Left eye exhibits no discharge. No scleral icterus.  Neck: Normal range of motion. No JVD present. No tracheal deviation present.  Pulmonary/Chest: Effort normal. No stridor.  Abdominal: Soft. She exhibits no distension and no mass. There is no tenderness. There is no rebound and no guarding. No hernia.  Genitourinary:  Genitourinary Comments: Large volume of hard stool in the rectal vault, no bleeding  Neurological: She is alert and oriented to person, place, and time. Coordination normal.  Psychiatric: She has a normal mood and affect. Her behavior is normal. Judgment and thought content normal.  Nursing note and vitals reviewed.    ED Treatments / Results  Labs (all labs ordered are listed, but only abnormal results are displayed) Labs Reviewed - No data to display  EKG None  Radiology No results found.  Procedures Procedures (including critical care time)  Medications Ordered in ED Medications  mineral oil enema 1 enema (1 enema Rectal Given 11/19/17 1342)     Initial Impression / Assessment and Plan / ED Course  I have reviewed the triage vital signs and the nursing notes.  Pertinent labs & imaging results that were available during my care of the patient were reviewed by  me and considered in my medical decision making (see chart for details).     Labs:   Imaging:  Consults:  Therapeutics: Mineral oil enema  Discharge Meds:   Assessment/Plan: 83 year old female presents today with rectal impaction.  Manual disimpaction relieved some of the stool burden but patient was still unable to have a bowel movement.  She was given a mineral oil based on her here that provided relief.  Patient discharged home with return precautions and symptomatic care instructions.  She verbalized understanding and agreement to today's plan had no further questions or concerns.    Final Clinical Impressions(s) / ED Diagnoses   Final diagnoses:  Constipation, unspecified constipation type    ED Discharge Orders    None  Okey Regal, PA-C 11/19/17 1658    Daleen Bo, MD 11/20/17 [phone]

## 2017-11-19 NOTE — Discharge Instructions (Addendum)
Please read attached information. If you experience any new or worsening signs or symptoms please return to the emergency room for evaluation. Please follow-up with your primary care provider or specialist as discussed. Please use Miralax as needed for constipation

## 2017-11-19 NOTE — ED Notes (Signed)
Patient verbalizes understanding of discharge instructions. Opportunity for questioning and answers were provided. Armband removed by staff, pt discharged from ED.  

## 2017-11-23 DIAGNOSIS — I10 Essential (primary) hypertension: Secondary | ICD-10-CM | POA: Diagnosis not present

## 2017-11-23 DIAGNOSIS — M130 Polyarthritis, unspecified: Secondary | ICD-10-CM | POA: Diagnosis not present

## 2017-11-23 DIAGNOSIS — N3281 Overactive bladder: Secondary | ICD-10-CM | POA: Diagnosis not present

## 2017-11-23 DIAGNOSIS — E782 Mixed hyperlipidemia: Secondary | ICD-10-CM | POA: Diagnosis not present

## 2017-11-24 ENCOUNTER — Ambulatory Visit: Payer: Medicare Other | Admitting: Physical Therapy

## 2017-11-24 ENCOUNTER — Encounter: Payer: Self-pay | Admitting: Physical Therapy

## 2017-11-24 DIAGNOSIS — M6281 Muscle weakness (generalized): Secondary | ICD-10-CM | POA: Diagnosis not present

## 2017-11-24 DIAGNOSIS — R2689 Other abnormalities of gait and mobility: Secondary | ICD-10-CM

## 2017-11-24 DIAGNOSIS — R2681 Unsteadiness on feet: Secondary | ICD-10-CM

## 2017-11-24 NOTE — Therapy (Signed)
[facility] [address] [place], [place], [address] Phone: [phone]   Fax:  [phone]  Physical Therapy Treatment  Patient Details  Name: Christina Austin MRN: [id number] Date of Birth: October 13, 1934 Referring Provider (PT): Dr. Lucianne Lei   Encounter Date: 11/24/2017  PT End of Session - 11/24/17 1256    Visit Number  5    Number of Visits  17    Date for PT Re-Evaluation  12/26/17    Authorization Type  [employer] Medicare- * requires 10th visit PN    PT Start Time  1022    PT Stop Time  1100    PT Time Calculation (min)  38 min    Activity Tolerance  Patient tolerated treatment well    Behavior During Therapy  [facility] for tasks assessed/performed       Past Medical History:  Diagnosis Date  . Anxiety   . Arthritis    legs, shoulders   . Benign essential HTN   . Chronic kidney disease, stage III (moderate) (HCC)    chronic kidney disease stage 3  . Depression   . GERD (gastroesophageal reflux disease)   . History of blood transfusion    post hysterectomy  . Hypertensive heart disease without heart failure   . Memory deficit   . Neuromuscular disorder (HCC)    intermittent facial weakness - but at times its severe enough that "I have gone to the hospital", [place] but [place] in [place]- seen by Dr. Loretta Plume  . Numbness of face   . Other and unspecified hyperlipidemia   . Plantar fasciitis   . Stroke [facility])    TIA  . Unspecified transient cerebral ischemia    takes plavix for 2ndary stroke prevention per Dr. Tomi Likens with neurology    Past Surgical History:  Procedure Laterality Date  . BILATERAL SALPINGOOPHORECTOMY    . BREAST LUMPECTOMY WITH RADIOACTIVE SEED LOCALIZATION Right 08/02/2015   Procedure: BREAST LUMPECTOMY WITH RADIOACTIVE SEED LOCALIZATION;  Surgeon: [name], MD;  Location: [place];  Service: General;  Laterality: Right;  . colonoscopy     removed polyps- to f/u in 6 months  . TOTAL ABDOMINAL  HYSTERECTOMY    . TRIGGER FINGER RELEASE Right 10/21/2016   Procedure: RELEASE TRIGGER FINGER/A-1 PULLEY RIGHT RING FINGER;  Surgeon: Daryll Brod, MD;  Location: [facility];  Service: Orthopedics;  Laterality: Right;    There were no vitals filed for this visit.  Subjective Assessment - 11/24/17 1023    Subjective  reporting no falls or stumbles; Reports using her cane for community distance ambulation for steadying and for performing stairs that do not have rails to prevent fall risk.     Currently in Pain?  No/denies           [facility] Adult PT Treatment/Exercise - 11/24/17 1246      Transfers   Transfers  Sit to Stand;Stand to [employer] Transfers    Sit to Stand  6: Modified independent (Device/Increase time)    Five time sit to stand comments   Pt performs 5x STS from standard height chair with no UE assist in 16.92 sec indicating improvement in LE power and reduction in fall risk potential when compared to previous assessment    Stand to Sit  6: Modified independent (Device/Increase time)      Ambulation/Gait   Ambulation/Gait  Yes    Ambulation/Gait Assistance  5: Supervision;4: Min guard    Ambulation/Gait Assistance Details  Pt performs x2 ambulation trials on level surface in door working on varying gait speed when cued, curbs, ramps, compliant surfaces, and uneven surfaces. Pt demonstrates ability to perform all stated with supervision with exception to walking over uneven surface requiring CGA for steadying 2/2 to posterior LOB. Pt does demonstrate utilization of stepping strategy during LOB episode to assist with recovering from stumble.     Ambulation Distance (Feet)  115 Feet   x2 ambulation trials   Assistive device  None    Gait Pattern  Step-through pattern;Decreased arm swing - right;Decreased arm swing - left;Decreased step length - right;Decreased step length - left;Decreased stride length;Decreased trunk rotation;Narrow base of support     Ambulation Surface  Level;Indoor;Unlevel    Gait velocity  3.39 ft/sec indicative of community ambulation    Stairs  Yes    Stairs Assistance  5: Supervision    Stair Management Technique  One rail Right;Alternating pattern    Number of Stairs  4   x2 trials   Height of Stairs  6    Ramp  5: Supervision    Curb  5: Supervision      Balance   Balance Assessed  Yes      Dynamic Standing Balance   Dynamic Standing - Comments  Dynamic standing balance incorporating forward stepping with horizontal head turns while standing on compliant surface placed on ramp. Pt progresses from alternating forward stepping and then performing horizontal head turns to alternating forward stepping while simultaneously performing head turns to challenge balance during functional mobility. Pt performs multiple trials while stepping on ramp incline progressing to multiple trials stepping on ramp decline.              PT Education - 11/24/17 1256    Education Details  Therapist provided education regarding clinical findings from short term goal reassessment and locations to purchase wider base cane tip for increased stability during community ambulation.     Person(s) Educated  Patient    Methods  Explanation    Comprehension  Verbalized understanding       PT Short Term Goals - 11/24/17 1031      PT SHORT TERM GOAL #1   Title  Pt will participate in establishment of initial HEP for improvement with strength and balance deficits    Baseline  11/12: Reports performing her HEP everyday    Time  4    Period  Weeks    Status  Achieved      PT SHORT TERM GOAL #2   Title  Pt will participate in [place] assessment to establish baseline fall risk potential     Time  4    Period  Weeks    Status  Achieved      PT SHORT TERM GOAL #3   Title  Pt will participate in SOT assessment with LTG as indicated     Baseline  10/25; Composite score of 30/65 for age related norm (81)    Time  4    Period   Weeks    Status  Achieved      PT SHORT TERM GOAL #4   Title  Pt will participate in 6 min walk test to establish baseline value for endurance with LRAD     Time  4    Period  Weeks    Status  Achieved      PT SHORT TERM GOAL #5   Title  Pt will improve 5x STS time to <20  seconds from standard height chair with no UE assist indicating improvement in LE power and reduction in fall risk potential     Baseline  11/12: Pt performs 5x STS from standard height chair with no UE assist in 16.91 seconds    Time  4    Period  Weeks    Status  Achieved      PT SHORT TERM GOAL #6   Title  Pt will improve her gait speed to >/= 2.1 ft/sec with LRAD to improve functional mobility     Baseline  11/12: 3.39 ft/sec with no AD     Time  4    Period  Weeks    Status  Achieved      PT SHORT TERM GOAL #7   Title  Pt will negotiate 8 steps with single HR demonstrating reciprocal ascending and step to descending with supervision to improve safety with home accessibility     Baseline  11/12: Patient performs 8 steps with single HR and reciprocal stepping technique indicating improvement in LE strength and functional mobility    Time  4    Period  Weeks    Status  Achieved      PT SHORT TERM GOAL #8   Title  Pt will ambulate 300' outdoors navigating uneven terrain, curbs, inclines, and demonstrating ability to vary gait speed with LRAD and therapist providing CGA to improve community ambulation   11/12: goal simulated indoors. Patient ambulates 300 feet performing ramps on compliant surface, curb, changes in gait speed, and ambulation over uneven surface requiring CGA to prevent fall. Pt demonstrates x1 LOB post. during amb. on compliant surface   Time  4    Period  Weeks    Status  Achieved        PT Long Term Goals - 11/24/17 1257      PT LONG TERM GOAL #1   Title  Pt will report independence and compliance with HEP to improve functional mobility     Time  8    Period  Weeks    Status  New       PT LONG TERM GOAL #2   Title  Pt will improve 6 min walk test by 50' indicating improvement in endurance during functional mobility     Time  8    Period  Weeks    Status  New      PT LONG TERM GOAL #3   Title  Pt will improve Berg balance score by 8 points indicating reduction in fall risk potential     Time  8    Period  Weeks    Status  New      PT LONG TERM GOAL #4   Title  Pt will improve 5xSTS to <15 seconds using no UE assist indicating improvement in LE power and reduction in fall risk potential    Time  8    Period  Weeks    Status  New      PT LONG TERM GOAL #5   Title  Pt will negotiate 15 steps with single HR and reciprocal stepping technique with therapist providing supervision demonstrating improvement in LE strength and functional mobility     Time  8    Period  Weeks    Status  New      PT LONG TERM GOAL #6   Title  Pt will improve gait speed to >/= 3.5 ft/sec with LRAD indicating improvement in functional mobility and reduction  in fall risk     Time  8    Period  Weeks    Status  Revised      PT LONG TERM GOAL #7   Title  Pt will ambulate 500' out doors navigating uneven terrain, curbs, inclines, and varying gait speed with LRAD at Mod I level to improve safety with community ambulation    Time  8    Period  Weeks    Status  New      PT LONG TERM GOAL #8   Title  Pt will improve SOT composite score to meet age related norms to reduce fall risk potential     Baseline  10/25: 30/65 composite score     Status  New            Plan - 11/24/17 1257    Clinical Impression Statement  Skilled session focused on reassessing remaining short term goals and continuing to implement dynamic standing balance into treatment session. Overall, patient tolerated treatment session well and has met all of her remaining STG's assessed today. Pt demonstrates improvement in LE power and reduction in fall risk potential indicated by her 5 x STS time of 16.91 seconds with no  UE assist. Pt demonstrates significant improvement in gait speed indicated by results of 3.39 ft/sec when assessed. Pt demonstrates ability to navigate uneven terrain, ramps, curbs, vary gait speed with supervision fading to CGA 2/2 to posterior LOB on uneven surface with ability to recover utilizing stepping strategy. Pt will continue to benefit from skilled PT to address balance deficits to improve safety with functional mobility.     Rehab Potential  Good    Clinical Impairments Affecting Rehab Potential  Chronicity of her condition; may have difficulty finding transportation- indicates she will be calling [facility]     PT Frequency  2x / week    PT Duration  8 weeks    PT Treatment/Interventions  ADLs/Self Care Home Management;DME Instruction;Balance training;Passive range of motion;Neuromuscular re-education;Gait training;Stair training;Functional mobility training;Energy conservation;Patient/family education;Therapeutic activities;Therapeutic exercise    PT Next Visit Plan  BALANCE STRATEGIES HIP AND STEP. rocker board with cone reaching for hip strategy, cone tapping, resisted gait, Gait training with cane, balance. foam beam tandem, hurdles, ball toss, bosu ball, Ask about her appt with vascular dc for any changes. challenge balance with vestibular input    PT Home Exercise Plan  [id number]    Consulted and Agree with Plan of Care  Patient       Patient will benefit from skilled therapeutic intervention in order to improve the following deficits and impairments:  Decreased knowledge of use of DME, Pain, Decreased coordination, Decreased mobility, Decreased activity tolerance, Decreased endurance, Decreased range of motion, Decreased strength, Decreased balance, Difficulty walking, Decreased skin integrity(Callus formation to dorsum of all toes )  Visit Diagnosis: Unsteadiness on feet  Other abnormalities of gait and mobility  Muscle weakness (generalized)     Problem List Patient Active  Problem List   Diagnosis Date Noted  . Hyperlipidemia 03/31/2016  . Hypertensive heart disease 03/31/2016  . Personal history of transient ischemic attack (TIA), and cerebral infarction without residual deficits 03/31/2016    Floreen Comber, SPT 11/24/2017, 1:02 PM  [facility] [address] [place], [place], [address] Phone: [phone]   Fax:  [phone]  Name: Christina Austin MRN: [id number] Date of Birth: 19-Oct-1934

## 2017-12-01 ENCOUNTER — Ambulatory Visit: Payer: Medicare Other | Admitting: Physical Therapy

## 2017-12-01 DIAGNOSIS — R2681 Unsteadiness on feet: Secondary | ICD-10-CM | POA: Diagnosis not present

## 2017-12-01 DIAGNOSIS — M6281 Muscle weakness (generalized): Secondary | ICD-10-CM | POA: Diagnosis not present

## 2017-12-01 DIAGNOSIS — R2689 Other abnormalities of gait and mobility: Secondary | ICD-10-CM | POA: Diagnosis not present

## 2017-12-02 NOTE — Therapy (Signed)
[facility] [address] [place], [place], [address] Phone: [phone]   Fax:  [phone]  Physical Therapy Treatment  Patient Details  Name: Christina Austin MRN: [id number] Date of Birth: 09/18/1934 Referring Provider (PT): Dr. Lucianne Lei   Encounter Date: 12/01/2017  PT End of Session - 12/02/17 2137    Visit Number  6    Number of Visits  17    Date for PT Re-Evaluation  12/26/17    Authorization Type  [employer]HC Medicare- * requires 10th visit PN    PT Start Time  1017    PT Stop Time  1102    PT Time Calculation (min)  45 min    Equipment Utilized During Treatment  Gait belt    Activity Tolerance  Patient tolerated treatment well    Behavior During Therapy  [facility] for tasks assessed/performed       Past Medical History:  Diagnosis Date  . Anxiety   . Arthritis    legs, shoulders   . Benign essential HTN   . Chronic kidney disease, stage III (moderate) (HCC)    chronic kidney disease stage 3  . Depression   . GERD (gastroesophageal reflux disease)   . History of blood transfusion    post hysterectomy  . Hypertensive heart disease without heart failure   . Memory deficit   . Neuromuscular disorder (HCC)    intermittent facial weakness - but at times its severe enough that "I [place] the [place] but [place] in [place]- seen by Dr. Loretta Plume  . Numbness of face   . Other and unspecified hyperlipidemia   . Plantar fasciitis   . Stroke [facility] [place] [facility])    TIA  . Unspecified transient cerebral ischemia    takes plavix for 2ndary stroke prevention per Dr. Tomi Likens with neurology    Past Surgical History:  Procedure Laterality Date  . BILATERAL SALPINGOOPHORECTOMY    . BREAST LUMPECTOMY WITH RADIOACTIVE SEED LOCALIZATION Right 08/02/2015   Procedure: BREAST LUMPECTOMY WITH RADIOACTIVE SEED LOCALIZATION;  Surgeon: [name], MD;  Location: [place];  Service: General;  Laterality: Right;  . colonoscopy      removed polyps- to f/u in 6 months  . TOTAL ABDOMINAL HYSTERECTOMY    . TRIGGER FINGER RELEASE Right 10/21/2016   Procedure: RELEASE TRIGGER FINGER/A-1 PULLEY RIGHT RING FINGER;  Surgeon: Daryll Brod, MD;  Location: [place] [facility];  Service: Orthopedics;  Laterality: Right;    There were no vitals filed for this visit.  Subjective Assessment - 12/02/17 2132    Subjective  Pt reports she tries not to use her cane but keeps it in car in case she feels somewhat off balance    Pertinent History  Stroke, HTN, depression, HLD, s/p trigger finger release 10/21/16, Reports cataract surgery (2019)    Limitations  Standing;Walking;Lifting    Patient Stated Goals  Improve her functional mobility to reduce overall near fall experiences and pain    Currently in Pain?  No/denies                       [facility] Adult PT Treatment/Exercise - 12/02/17 0001      Transfers   Transfers  Sit to Stand    Sit to Stand  4: Min guard    Number of Reps  10 reps    Comments  feet on [employer]; no UE support used      Ambulation/Gait  Ambulation/Gait  Yes    Ambulation/Gait Assistance  4: Min guard    Ambulation/Gait Assistance Details  performed tossing ball for improved multi - tasking with gait     Ambulation Distance (Feet)  230 Feet    Assistive device  None    Gait Pattern  Step-through pattern;Decreased arm swing - right;Decreased arm swing - left;Decreased step length - right;Decreased step length - left;Decreased stride length;Decreased trunk rotation;Narrow base of support    Ambulation Surface  Level;Indoor          Balance Exercises - 12/02/17 2134      Balance Exercises: Standing   SLS with Vectors  Foam/compliant surface;5 reps    Rockerboard  Anterior/posterior;10 reps    Retro Gait  1 rep;Foam/compliant surface   5' x 3 reps on red mat with min to CGA   Other Standing Exercises  marching on red mat 10 reps each; crossovers in front and then behind 5 reps each  direction with min assist                                      Tap ups to 1st and 2nd step with RLE and LLE - 10 reps each foot  Marching forward and backward on red mat for compliant surface training - 2 reps with min assist for recovery of LOB    PT Short Term Goals - 11/24/17 1031      PT SHORT TERM GOAL #1   Title  Pt will participate in establishment of initial HEP for improvement with strength and balance deficits    Baseline  11/12: Reports performing her HEP everyday    Time  4    Period  Weeks    Status  Achieved      PT SHORT TERM GOAL #2   Title  Pt will participate in [place] assessment to establish baseline fall risk potential     Time  4    Period  Weeks    Status  Achieved      PT SHORT TERM GOAL #3   Title  Pt will participate in SOT assessment with LTG as indicated     Baseline  10/25; Composite score of 30/65 for age related norm (79)    Time  4    Period  Weeks    Status  Achieved      PT SHORT TERM GOAL #4   Title  Pt will participate in 6 min walk test to establish baseline value for endurance with LRAD     Time  4    Period  Weeks    Status  Achieved      PT SHORT TERM GOAL #5   Title  Pt will improve 5x STS time to <20 seconds from standard height chair with no UE assist indicating improvement in LE power and reduction in fall risk potential     Baseline  11/12: Pt performs 5x STS from standard height chair with no UE assist in 16.91 seconds    Time  4    Period  Weeks    Status  Achieved      PT SHORT TERM GOAL #6   Title  Pt will improve her gait speed to >/= 2.1 ft/sec with LRAD to improve functional mobility     Baseline  11/12: 3.39 ft/sec with no AD     Time  4  Period  Weeks    Status  Achieved      PT SHORT TERM GOAL #7   Title  Pt will negotiate 8 steps with single HR demonstrating reciprocal ascending and step to descending with supervision to improve safety with home accessibility     Baseline  11/12: Patient performs 8  steps with single HR and reciprocal stepping technique indicating improvement in LE strength and functional mobility    Time  4    Period  Weeks    Status  Achieved      PT SHORT TERM GOAL #8   Title  Pt will ambulate 300' outdoors navigating uneven terrain, curbs, inclines, and demonstrating ability to vary gait speed with LRAD and therapist providing CGA to improve community ambulation   11/12: goal simulated indoors. Patient ambulates 300 feet performing ramps on compliant surface, curb, changes in gait speed, and ambulation over uneven surface requiring CGA to prevent fall. Pt demonstrates x1 LOB post. during amb. on compliant surface   Time  4    Period  Weeks    Status  Achieved        PT Long Term Goals - 11/24/17 1257      PT LONG TERM GOAL #1   Title  Pt will report independence and compliance with HEP to improve functional mobility     Time  8    Period  Weeks    Status  New      PT LONG TERM GOAL #2   Title  Pt will improve 6 min walk test by 50' indicating improvement in endurance during functional mobility     Time  8    Period  Weeks    Status  New      PT LONG TERM GOAL #3   Title  Pt will improve Berg balance score by 8 points indicating reduction in fall risk potential     Time  8    Period  Weeks    Status  New      PT LONG TERM GOAL #4   Title  Pt will improve 5xSTS to <15 seconds using no UE assist indicating improvement in LE power and reduction in fall risk potential    Time  8    Period  Weeks    Status  New      PT LONG TERM GOAL #5   Title  Pt will negotiate 15 steps with single HR and reciprocal stepping technique with therapist providing supervision demonstrating improvement in LE strength and functional mobility     Time  8    Period  Weeks    Status  New      PT LONG TERM GOAL #6   Title  Pt will improve gait speed to >/= 3.5 ft/sec with LRAD indicating improvement in functional mobility and reduction in fall risk     Time  8    Period   Weeks    Status  Revised      PT LONG TERM GOAL #7   Title  Pt will ambulate 500' out doors navigating uneven terrain, curbs, inclines, and varying gait speed with LRAD at Mod I level to improve safety with community ambulation    Time  8    Period  Weeks    Status  New      PT LONG TERM GOAL #8   Title  Pt will improve SOT composite score to meet age related norms to reduce fall risk potential  Baseline  10/25: 30/65 composite score     Status  New            Plan - 12/02/17 2138    Clinical Impression Statement  Pt demonstrates decreased high level balance skills with tendency to keep feet very close together resulting in narrow BOS, impacting balance.  Pt able to widen BOS with cues.  Pt also had some mild difficulty with amb. tossing ball backwards, but did well with forward amb. with tossing ball.  Pt progressing well towards goals.      Rehab Potential  Good    Clinical Impairments Affecting Rehab Potential  Chronicity of her condition; may have difficulty finding transportation- indicates she will be calling [facility]     PT Frequency  2x / week    PT Duration  8 weeks    PT Treatment/Interventions  ADLs/Self Care Home Management;DME Instruction;Balance training;Passive range of motion;Neuromuscular re-education;Gait training;Stair training;Functional mobility training;Energy conservation;Patient/family education;Therapeutic activities;Therapeutic exercise    PT Next Visit Plan  cont dynamic standing balance    PT Home Exercise Plan  [id number]    Consulted and Agree with Plan of Care  Patient       Patient will benefit from skilled therapeutic intervention in order to improve the following deficits and impairments:  Decreased knowledge of use of DME, Pain, Decreased coordination, Decreased mobility, Decreased activity tolerance, Decreased endurance, Decreased range of motion, Decreased strength, Decreased balance, Difficulty walking, Decreased skin integrity  Visit  Diagnosis: Unsteadiness on feet  Other abnormalities of gait and mobility     Problem List Patient Active Problem List   Diagnosis Date Noted  . Hyperlipidemia 03/31/2016  . Hypertensive heart disease 03/31/2016  . Personal history of transient ischemic attack (TIA), and cerebral infarction without residual deficits 03/31/2016    Shammond Arave, [place], PT 12/02/2017, 9:42 PM  [facility] [address] [place], [place], [address] Phone: [phone]   Fax:  [phone]  Name: Christina Austin MRN: [id number] Date of Birth: [birth year]

## 2017-12-08 ENCOUNTER — Ambulatory Visit: Payer: Medicare Other | Admitting: Physical Therapy

## 2017-12-08 DIAGNOSIS — R2689 Other abnormalities of gait and mobility: Secondary | ICD-10-CM

## 2017-12-08 DIAGNOSIS — M6281 Muscle weakness (generalized): Secondary | ICD-10-CM | POA: Diagnosis not present

## 2017-12-08 DIAGNOSIS — R2681 Unsteadiness on feet: Secondary | ICD-10-CM

## 2017-12-09 NOTE — Therapy (Signed)
[facility] [address] [place], [place], [address] Phone: [phone]   Fax:  [phone]  Physical Therapy Treatment  Patient Details  Name: Christina Austin MRN: [id number] Date of Birth: [birth year] Referring Provider (PT): Dr. Lucianne Lei   Encounter Date: 12/08/2017  PT End of Session - 12/09/17 1419    Visit Number  7    Number of Visits  17    Date for PT Re-Evaluation  12/26/17    Authorization Type  [employer] Medicare- * requires 10th visit PN    PT Start Time  1016    PT Stop Time  1102    PT Time Calculation (min)  46 min       Past Medical History:  Diagnosis Date  . Anxiety   . Arthritis    legs, shoulders   . Benign essential HTN   . Chronic kidney disease, stage III (moderate) (HCC)    chronic kidney disease stage 3  . Depression   . GERD (gastroesophageal reflux disease)   . History of blood transfusion    post hysterectomy  . Hypertensive heart disease without heart failure   . Memory deficit   . Neuromuscular disorder (HCC)    intermittent facial weakness - but at times its severe enough that "I have [place] the hospital[place] but [place] in [place]- seen by Dr. Loretta Plume  . Numbness of face   . Other and unspecified hyperlipidemia   . Plantar fasciitis   . Stroke [facility])    TIA  . Unspecified transient cerebral ischemia    takes plavix for 2ndary stroke prevention per Dr. Tomi Likens with neurology    Past Surgical History:  Procedure Laterality Date  . BILATERAL SALPINGOOPHORECTOMY    . BREAST LUMPECTOMY WITH RADIOACTIVE SEED LOCALIZATION Right 08/02/2015   Procedure: BREAST LUMPECTOMY WITH RADIOACTIVE SEED LOCALIZATION;  Surgeon: [name], MD;  Location: [facility];  Service: General;  Laterality: Right;  . colonoscopy     removed polyps- to f/u in 6 months  . TOTAL ABDOMINAL HYSTERECTOMY    . TRIGGER FINGER RELEASE Right 10/21/2016   Procedure: RELEASE TRIGGER FINGER/A-1 PULLEY RIGHT RING  FINGER;  Surgeon: Daryll Brod, MD;  Location: [facility];  Service: Orthopedics;  Laterality: Right;    There were no vitals filed for this visit.  Subjective Assessment - 12/09/17 1416    Subjective  Pt reports no problems or changes since PT session last week    Pertinent History  Stroke, HTN, depression, HLD, s/p trigger finger release 10/21/16, Reports cataract surgery (2019)    Limitations  Standing;Walking;Lifting    Patient Stated Goals  Improve her functional mobility to reduce overall near fall experiences and pain    Currently in Pain?  No/denies                       [facility] Adult PT Treatment/Exercise - 12/09/17 0001      Transfers   Transfers  Sit to Stand    Sit to Stand  4: Min guard    Number of Reps  Other reps (comment)   5   Comments  no UE support used      Ambulation/Gait   Ambulation/Gait  Yes    Ambulation/Gait Assistance  4: Min guard    Ambulation/Gait Assistance Details  tossing ball for multi-tasking with gait    Ambulation Distance (Feet)  115 Feet    Assistive device  None  Gait Pattern  Step-through pattern;Decreased arm swing - right;Decreased arm swing - left;Decreased step length - right;Decreased step length - left;Decreased stride length;Decreased trunk rotation;Narrow base of support    Ambulation Surface  Level;Indoor      Exercises   Exercises  Knee/Hip      Knee/Hip Exercises: Aerobic   Nustep  Level 3 x 10" with UE's and LE's       Balance Exercises: Standing  SLS with Vectors  Foam/compliant surface;5 reps   Rockerboard  Anterior/posterior;10 reps                  PT Short Term Goals - 11/24/17 1031      PT SHORT TERM GOAL #1   Title  Pt will participate in establishment of initial HEP for improvement with strength and balance deficits    Baseline  11/12: Reports performing her HEP everyday    Time  4    Period  Weeks    Status  Achieved      PT SHORT TERM GOAL #2   Title  Pt will  participate in [place] assessment to establish baseline fall risk potential     Time  4    Period  Weeks    Status  Achieved      PT SHORT TERM GOAL #3   Title  Pt will participate in SOT assessment with LTG as indicated     Baseline  10/25; Composite score of 30/65 for age related norm (81)    Time  4    Period  Weeks    Status  Achieved      PT SHORT TERM GOAL #4   Title  Pt will participate in 6 min walk test to establish baseline value for endurance with LRAD     Time  4    Period  Weeks    Status  Achieved      PT SHORT TERM GOAL #5   Title  Pt will improve 5x STS time to <20 seconds from standard height chair with no UE assist indicating improvement in LE power and reduction in fall risk potential     Baseline  11/12: Pt performs 5x STS from standard height chair with no UE assist in 16.91 seconds    Time  4    Period  Weeks    Status  Achieved      PT SHORT TERM GOAL #6   Title  Pt will improve her gait speed to >/= 2.1 ft/sec with LRAD to improve functional mobility     Baseline  11/12: 3.39 ft/sec with no AD     Time  4    Period  Weeks    Status  Achieved      PT SHORT TERM GOAL #7   Title  Pt will negotiate 8 steps with single HR demonstrating reciprocal ascending and step to descending with supervision to improve safety with home accessibility     Baseline  11/12: Patient performs 8 steps with single HR and reciprocal stepping technique indicating improvement in LE strength and functional mobility    Time  4    Period  Weeks    Status  Achieved      PT SHORT TERM GOAL #8   Title  Pt will ambulate 300' outdoors navigating uneven terrain, curbs, inclines, and demonstrating ability to vary gait speed with LRAD and therapist providing CGA to improve community ambulation   11/12: goal simulated indoors. Patient ambulates 300 feet  performing ramps on compliant surface, curb, changes in gait speed, and ambulation over uneven surface requiring CGA to prevent fall. Pt  demonstrates x1 LOB post. during amb. on compliant surface   Time  4    Period  Weeks    Status  Achieved        PT Long Term Goals - 11/24/17 1257      PT LONG TERM GOAL #1   Title  Pt will report independence and compliance with HEP to improve functional mobility     Time  8    Period  Weeks    Status  New      PT LONG TERM GOAL #2   Title  Pt will improve 6 min walk test by 50' indicating improvement in endurance during functional mobility     Time  8    Period  Weeks    Status  New      PT LONG TERM GOAL #3   Title  Pt will improve Berg balance score by 8 points indicating reduction in fall risk potential     Time  8    Period  Weeks    Status  New      PT LONG TERM GOAL #4   Title  Pt will improve 5xSTS to <15 seconds using no UE assist indicating improvement in LE power and reduction in fall risk potential    Time  8    Period  Weeks    Status  New      PT LONG TERM GOAL #5   Title  Pt will negotiate 15 steps with single HR and reciprocal stepping technique with therapist providing supervision demonstrating improvement in LE strength and functional mobility     Time  8    Period  Weeks    Status  New      PT LONG TERM GOAL #6   Title  Pt will improve gait speed to >/= 3.5 ft/sec with LRAD indicating improvement in functional mobility and reduction in fall risk     Time  8    Period  Weeks    Status  Revised      PT LONG TERM GOAL #7   Title  Pt will ambulate 500' out doors navigating uneven terrain, curbs, inclines, and varying gait speed with LRAD at Mod I level to improve safety with community ambulation    Time  8    Period  Weeks    Status  New      PT LONG TERM GOAL #8   Title  Pt will improve SOT composite score to meet age related norms to reduce fall risk potential     Baseline  10/25: 30/65 composite score     Status  New            Plan - 12/09/17 1420    Clinical Impression Statement  Pt progressing well towards goals; continues to  need cues to keep feet separated for increased BOS for increased balance- pt has tendency to keep feet very close together which impacts balance;    Rehab Potential  Good    Clinical Impairments Affecting Rehab Potential  Chronicity of her condition; may have difficulty finding transportation- indicates she will be calling [facility]     PT Frequency  2x / week    PT Duration  8 weeks    PT Treatment/Interventions  ADLs/Self Care Home Management;DME Instruction;Balance training;Passive range of motion;Neuromuscular re-education;Gait training;Stair training;Functional mobility training;Energy conservation;Patient/family education;Therapeutic  activities;Therapeutic exercise    PT Next Visit Plan  cont dynamic standing balance    PT Home Exercise Plan  [id number]    Consulted and Agree with Plan of Care  Patient       Patient will benefit from skilled therapeutic intervention in order to improve the following deficits and impairments:  Decreased knowledge of use of DME, Pain, Decreased coordination, Decreased mobility, Decreased activity tolerance, Decreased endurance, Decreased range of motion, Decreased strength, Decreased balance, Difficulty walking, Decreased skin integrity  Visit Diagnosis: Unsteadiness on feet  Other abnormalities of gait and mobility  Muscle weakness (generalized)     Problem List Patient Active Problem List   Diagnosis Date Noted  . Hyperlipidemia 03/31/2016  . Hypertensive heart disease 03/31/2016  . Personal history of transient ischemic attack (TIA), and cerebral infarction without residual deficits 03/31/2016    Ellah Otte, [place], PT 12/09/2017, 2:22 PM  [facility] [address] [place], [place], [address] Phone: [phone]   Fax:  [phone]  Name: ABIGAEL MOGLE MRN: [id number] Date of Birth: Sep 11, 1934

## 2017-12-15 ENCOUNTER — Ambulatory Visit: Payer: Medicare Other | Attending: Family Medicine | Admitting: Physical Therapy

## 2017-12-15 VITALS — BP 157/96

## 2017-12-15 DIAGNOSIS — M6281 Muscle weakness (generalized): Secondary | ICD-10-CM | POA: Diagnosis not present

## 2017-12-15 DIAGNOSIS — R2681 Unsteadiness on feet: Secondary | ICD-10-CM | POA: Diagnosis not present

## 2017-12-15 DIAGNOSIS — R2689 Other abnormalities of gait and mobility: Secondary | ICD-10-CM | POA: Diagnosis not present

## 2017-12-16 NOTE — Therapy (Signed)
[facility] [address] [place], [place], [address] Phone: [phone]   Fax:  [phone]  Physical Therapy Treatment  Patient Details  Name: Christina Austin MRN: [id number] Date of Birth: June 14, 1934 Referring Provider (PT): Dr. Lucianne Lei   Encounter Date: 12/15/2017  PT End of Session - 12/16/17 2144    Visit Number  8    Number of Visits  17    Date for PT Re-Evaluation  12/26/17    Authorization Type  [employer] Medicare- * requires 10th visit PN    PT Start Time  1023    PT Stop Time  1107    PT Time Calculation (min)  44 min       Past Medical History:  Diagnosis Date  . Anxiety   . Arthritis    legs, shoulders   . Benign essential HTN   . Chronic kidney disease, stage III (moderate) (HCC)    chronic kidney disease stage 3  . Depression   . GERD (gastroesophageal reflux disease)   . History of blood transfusion    post hysterectomy  . Hypertensive heart disease without heart failure   . Memory deficit   . Neuromuscular disorder (HCC)    intermittent facial weakness - but at times its severe enough that "I have gone to the hospital", [place] of state but here in [place]- seen by Dr. Loretta Plume  . Numbness of face   . Other and unspecified hyperlipidemia   . Plantar fasciitis   . Stroke [facility])    TIA  . Unspecified transient cerebral ischemia    takes plavix for 2ndary stroke prevention per Dr. Tomi Likens with neurology    Past Surgical History:  Procedure Laterality Date  . BILATERAL SALPINGOOPHORECTOMY    . BREAST LUMPECTOMY WITH RADIOACTIVE SEED LOCALIZATION Right 08/02/2015   Procedure: BREAST LUMPECTOMY WITH RADIOACTIVE SEED LOCALIZATION;  Surgeon: [name], MD;  Location: [facility];  Service: General;  Laterality: Right;  . colonoscopy     removed polyps- to f/u in 6 months  . TOTAL ABDOMINAL HYSTERECTOMY    . TRIGGER FINGER RELEASE Right 10/21/2016   Procedure: RELEASE TRIGGER FINGER/A-1 PULLEY RIGHT RING  FINGER;  Surgeon: Daryll Brod, MD;  Location: [facility];  Service: Orthopedics;  Laterality: Right;    Vitals:   12/15/17 1028  BP: (!) 157/96    Subjective Assessment - 12/16/17 2140    Subjective  Pt reports she is walking about 1/4 block - walking with her daughter    Pertinent History  Stroke, HTN, depression, HLD, s/p trigger finger release 10/21/16, Reports cataract surgery (2019)    Limitations  Standing;Walking;Lifting    Patient Stated Goals  Improve her functional mobility to reduce overall near fall experiences and pain    Currently in Pain?  No/denies                       [facility] Adult PT Treatment/Exercise - 12/16/17 0001      Ambulation/Gait   Ambulation/Gait  Yes    Ambulation/Gait Assistance  5: Supervision    Ambulation Distance (Feet)  115 Feet    Assistive device  None    Gait Pattern  Step-through pattern;Decreased arm swing - right;Decreased arm swing - left;Decreased step length - right;Decreased step length - left;Decreased stride length;Decreased trunk rotation;Narrow base of support    Ambulation Surface  Level;Indoor      Exercises   Exercises  Knee/Hip  Knee/Hip Exercises: Aerobic   Nustep  Level 3 x 5" with UE's and LE's      Knee/Hip Exercises: Standing   Heel Raises  10 reps    Forward Step Up  Both;1 set;10 reps;Hand Hold: 1;Step Height: 6"          Balance Exercises - 12/16/17 2143      Balance Exercises: Standing   SLS with Vectors  Foam/compliant surface;5 reps    Other Standing Exercises  marching on blue mat - 10 reps each leg;  stepping up/back and laterally with CGA 10 reps each foot          PT Short Term Goals - 11/24/17 1031      PT SHORT TERM GOAL #1   Title  Pt will participate in establishment of initial HEP for improvement with strength and balance deficits    Baseline  11/12: Reports performing her HEP everyday    Time  4    Period  Weeks    Status  Achieved      PT SHORT TERM  GOAL #2   Title  Pt will participate in [place] assessment to establish baseline fall risk potential     Time  4    Period  Weeks    Status  Achieved      PT SHORT TERM GOAL #3   Title  Pt will participate in SOT assessment with LTG as indicated     Baseline  10/25; Composite score of 30/65 for age related norm (79)    Time  4    Period  Weeks    Status  Achieved      PT SHORT TERM GOAL #4   Title  Pt will participate in 6 min walk test to establish baseline value for endurance with LRAD     Time  4    Period  Weeks    Status  Achieved      PT SHORT TERM GOAL #5   Title  Pt will improve 5x STS time to <20 seconds from standard height chair with no UE assist indicating improvement in LE power and reduction in fall risk potential     Baseline  11/12: Pt performs 5x STS from standard height chair with no UE assist in 16.91 seconds    Time  4    Period  Weeks    Status  Achieved      PT SHORT TERM GOAL #6   Title  Pt will improve her gait speed to >/= 2.1 ft/sec with LRAD to improve functional mobility     Baseline  11/12: 3.39 ft/sec with no AD     Time  4    Period  Weeks    Status  Achieved      PT SHORT TERM GOAL #7   Title  Pt will negotiate 8 steps with single HR demonstrating reciprocal ascending and step to descending with supervision to improve safety with home accessibility     Baseline  11/12: Patient performs 8 steps with single HR and reciprocal stepping technique indicating improvement in LE strength and functional mobility    Time  4    Period  Weeks    Status  Achieved      PT SHORT TERM GOAL #8   Title  Pt will ambulate 300' outdoors navigating uneven terrain, curbs, inclines, and demonstrating ability to vary gait speed with LRAD and therapist providing CGA to improve community ambulation   11/12: goal simulated  indoors. Patient ambulates 300 feet performing ramps on compliant surface, curb, changes in gait speed, and ambulation over uneven surface  requiring CGA to prevent fall. Pt demonstrates x1 LOB post. during amb. on compliant surface   Time  4    Period  Weeks    Status  Achieved        PT Long Term Goals - 11/24/17 1257      PT LONG TERM GOAL #1   Title  Pt will report independence and compliance with HEP to improve functional mobility     Time  8    Period  Weeks    Status  New      PT LONG TERM GOAL #2   Title  Pt will improve 6 min walk test by 50' indicating improvement in endurance during functional mobility     Time  8    Period  Weeks    Status  New      PT LONG TERM GOAL #3   Title  Pt will improve Berg balance score by 8 points indicating reduction in fall risk potential     Time  8    Period  Weeks    Status  New      PT LONG TERM GOAL #4   Title  Pt will improve 5xSTS to <15 seconds using no UE assist indicating improvement in LE power and reduction in fall risk potential    Time  8    Period  Weeks    Status  New      PT LONG TERM GOAL #5   Title  Pt will negotiate 15 steps with single HR and reciprocal stepping technique with therapist providing supervision demonstrating improvement in LE strength and functional mobility     Time  8    Period  Weeks    Status  New      PT LONG TERM GOAL #6   Title  Pt will improve gait speed to >/= 3.5 ft/sec with LRAD indicating improvement in functional mobility and reduction in fall risk     Time  8    Period  Weeks    Status  Revised      PT LONG TERM GOAL #7   Title  Pt will ambulate 500' out doors navigating uneven terrain, curbs, inclines, and varying gait speed with LRAD at Mod I level to improve safety with community ambulation    Time  8    Period  Weeks    Status  New      PT LONG TERM GOAL #8   Title  Pt will improve SOT composite score to meet age related norms to reduce fall risk potential     Baseline  10/25: 30/65 composite score     Status  New            Plan - 12/16/17 2145    Clinical Impression Statement  Pt reporting some  light-headedness today - BP recorded and noted to be elevated; educated pt on need to record BP at home and contact MD if readings remain high (diastolic reading 90 as taken manually; initial reading with systolic 96 taken with automatic BP wrist BP cuff); balance continues to improve on solid surface but pt needs min assist for balance with standing on compliant surface        Rehab Potential  Good    Clinical Impairments Affecting Rehab Potential  Chronicity of her condition; may have difficulty finding transportation- indicates she  will be calling [facility]     PT Frequency  2x / week    PT Duration  8 weeks    PT Treatment/Interventions  ADLs/Self Care Home Management;DME Instruction;Balance training;Passive range of motion;Neuromuscular re-education;Gait training;Stair training;Functional mobility training;Energy conservation;Patient/family education;Therapeutic activities;Therapeutic exercise    PT Next Visit Plan  cont dynamic standing balance    PT Home Exercise Plan  [id number]    Consulted and Agree with Plan of Care  Patient       Patient will benefit from skilled therapeutic intervention in order to improve the following deficits and impairments:  Decreased knowledge of use of DME, Pain, Decreased coordination, Decreased mobility, Decreased activity tolerance, Decreased endurance, Decreased range of motion, Decreased strength, Decreased balance, Difficulty walking, Decreased skin integrity  Visit Diagnosis: Other abnormalities of gait and mobility  Unsteadiness on feet  Muscle weakness (generalized)     Problem List Patient Active Problem List   Diagnosis Date Noted  . Hyperlipidemia 03/31/2016  . Hypertensive heart disease 03/31/2016  . Personal history of transient ischemic attack (TIA), and cerebral infarction without residual deficits 03/31/2016    Caillou Minus, [place], PT 12/16/2017, 9:58 PM  [facility] [address] [place], [place], [address] Phone: [phone]   Fax:  [phone]  Name: EKAM BONEBRAKE MRN: [id number] Date of Birth: 04/24/1934

## 2017-12-22 ENCOUNTER — Ambulatory Visit: Payer: Medicare Other | Admitting: Physical Therapy

## 2017-12-22 DIAGNOSIS — H18413 Arcus senilis, bilateral: Secondary | ICD-10-CM | POA: Diagnosis not present

## 2017-12-22 DIAGNOSIS — Z961 Presence of intraocular lens: Secondary | ICD-10-CM | POA: Diagnosis not present

## 2017-12-22 DIAGNOSIS — H02831 Dermatochalasis of right upper eyelid: Secondary | ICD-10-CM | POA: Diagnosis not present

## 2017-12-22 DIAGNOSIS — H04123 Dry eye syndrome of bilateral lacrimal glands: Secondary | ICD-10-CM | POA: Diagnosis not present

## 2017-12-24 ENCOUNTER — Ambulatory Visit: Payer: Medicare Other | Admitting: Physical Therapy

## 2017-12-24 DIAGNOSIS — R2689 Other abnormalities of gait and mobility: Secondary | ICD-10-CM

## 2017-12-24 DIAGNOSIS — R2681 Unsteadiness on feet: Secondary | ICD-10-CM

## 2017-12-24 DIAGNOSIS — M6281 Muscle weakness (generalized): Secondary | ICD-10-CM

## 2017-12-25 NOTE — Therapy (Signed)
[facility] [address] [place], [place], [address] Phone: [phone]   Fax:  [phone]  Physical Therapy Treatment  Patient Details  Name: Christina Austin MRN: [id number] Date of Birth: [birth year] Referring Provider (PT): Dr. Lucianne Lei   Encounter Date: 12/24/2017  PT End of Session - 12/25/17 1308    Visit Number  9    Number of Visits  17    Date for PT Re-Evaluation  12/26/17    Authorization Type  [employer] Medicare- * requires 10th visit PN    PT Start Time  1020    PT Stop Time  1103    PT Time Calculation (min)  43 min       Past Medical History:  Diagnosis Date  . Anxiety   . Arthritis    legs, shoulders   . Benign essential HTN   . Chronic kidney disease, stage III (moderate) (HCC)    chronic kidney disease stage 3  . Depression   . GERD (gastroesophageal reflux disease)   . History of blood transfusion    post hysterectomy  . Hypertensive heart disease without heart failure   . Memory deficit   . Neuromuscular disorder (HCC)    intermittent facial weakness - but at times its severe enough that "I have gone to the hospital", [place] of state but here in [place]- seen by Dr. Loretta Plume  . Numbness of face   . Other and unspecified hyperlipidemia   . Plantar fasciitis   . Stroke [facility])    TIA  . Unspecified transient cerebral ischemia    takes plavix for 2ndary stroke prevention per Dr. Tomi Likens with neurology    Past Surgical History:  Procedure Laterality Date  . BILATERAL SALPINGOOPHORECTOMY    . BREAST LUMPECTOMY WITH RADIOACTIVE SEED LOCALIZATION Right 08/02/2015   Procedure: BREAST LUMPECTOMY WITH RADIOACTIVE SEED LOCALIZATION;  Surgeon: [name], MD;  Location: [facility];  Service: General;  Laterality: Right;  . colonoscopy     removed polyps- to f/u in 6 months  . TOTAL ABDOMINAL HYSTERECTOMY    . TRIGGER FINGER RELEASE Right 10/21/2016   Procedure: RELEASE TRIGGER FINGER/A-1 PULLEY RIGHT RING  FINGER;  Surgeon: Daryll Brod, MD;  Location: [facility];  Service: Orthopedics;  Laterality: Right;    There were no vitals filed for this visit.  Subjective Assessment - 12/25/17 1306    Subjective  Pt states she is doing well - feels she will be ready to finish up in a couple of more weeks    Pertinent History  Stroke, HTN, depression, HLD, s/p trigger finger release 10/21/16, Reports cataract surgery (2019)    Limitations  Standing;Walking;Lifting    Patient Stated Goals  Improve her functional mobility to reduce overall near fall experiences and pain    Currently in Pain?  No/denies              Balance Exercises: Standing  SLS with Vectors  Foam/compliant surface;5 reps   Rockerboard  Anterior/posterior;10 reps   Retro Gait  1 rep;Foam/compliant surface   5' x 3 reps on red mat with min to CGA               [facility] Adult PT Treatment/Exercise - 12/25/17 0001      Transfers   Transfers  Sit to Stand    Sit to Stand  5: Supervision    Number of Reps  Other reps (comment)   5   Comments  no  UE support used      Knee/Hip Exercises: Aerobic   Nustep  Level 3 x 5" with UE's and LE's      Knee/Hip Exercises: Standing   Heel Raises  10 reps    Forward Step Up  Both;1 set;10 reps;Hand Hold: 1;Step Height: 6"          Balance Exercises - 12/25/17 1307      Balance Exercises: Standing   Rockerboard  Anterior/posterior;10 reps    Retro Gait  1 rep;Foam/compliant surface   5' x 3 reps on red mat with min to CGA   Other Standing Exercises  marching on red mat 10 reps each; crossovers in front and then behind 5 reps each direction with min assist                                          PT Short Term Goals - 11/24/17 1031      PT SHORT TERM GOAL #1   Title  Pt will participate in establishment of initial HEP for improvement with strength and balance deficits    Baseline  11/12: Reports performing her HEP everyday    Time  4    Period   Weeks    Status  Achieved      PT SHORT TERM GOAL #2   Title  Pt will participate in [place] assessment to establish baseline fall risk potential     Time  4    Period  Weeks    Status  Achieved      PT SHORT TERM GOAL #3   Title  Pt will participate in SOT assessment with LTG as indicated     Baseline  10/25; Composite score of 30/65 for age related norm (81)    Time  4    Period  Weeks    Status  Achieved      PT SHORT TERM GOAL #4   Title  Pt will participate in 6 min walk test to establish baseline value for endurance with LRAD     Time  4    Period  Weeks    Status  Achieved      PT SHORT TERM GOAL #5   Title  Pt will improve 5x STS time to <20 seconds from standard height chair with no UE assist indicating improvement in LE power and reduction in fall risk potential     Baseline  11/12: Pt performs 5x STS from standard height chair with no UE assist in 16.91 seconds    Time  4    Period  Weeks    Status  Achieved      PT SHORT TERM GOAL #6   Title  Pt will improve her gait speed to >/= 2.1 ft/sec with LRAD to improve functional mobility     Baseline  11/12: 3.39 ft/sec with no AD     Time  4    Period  Weeks    Status  Achieved      PT SHORT TERM GOAL #7   Title  Pt will negotiate 8 steps with single HR demonstrating reciprocal ascending and step to descending with supervision to improve safety with home accessibility     Baseline  11/12: Patient performs 8 steps with single HR and reciprocal stepping technique indicating improvement in LE strength and functional mobility    Time  4  Period  Weeks    Status  Achieved      PT SHORT TERM GOAL #8   Title  Pt will ambulate 300' outdoors navigating uneven terrain, curbs, inclines, and demonstrating ability to vary gait speed with LRAD and therapist providing CGA to improve community ambulation   11/12: goal simulated indoors. Patient ambulates 300 feet performing ramps on compliant surface, curb, changes in gait  speed, and ambulation over uneven surface requiring CGA to prevent fall. Pt demonstrates x1 LOB post. during amb. on compliant surface   Time  4    Period  Weeks    Status  Achieved        PT Long Term Goals - 11/24/17 1257      PT LONG TERM GOAL #1   Title  Pt will report independence and compliance with HEP to improve functional mobility     Time  8    Period  Weeks    Status  New      PT LONG TERM GOAL #2   Title  Pt will improve 6 min walk test by 50' indicating improvement in endurance during functional mobility     Time  8    Period  Weeks    Status  New      PT LONG TERM GOAL #3   Title  Pt will improve Berg balance score by 8 points indicating reduction in fall risk potential     Time  8    Period  Weeks    Status  New      PT LONG TERM GOAL #4   Title  Pt will improve 5xSTS to <15 seconds using no UE assist indicating improvement in LE power and reduction in fall risk potential    Time  8    Period  Weeks    Status  New      PT LONG TERM GOAL #5   Title  Pt will negotiate 15 steps with single HR and reciprocal stepping technique with therapist providing supervision demonstrating improvement in LE strength and functional mobility     Time  8    Period  Weeks    Status  New      PT LONG TERM GOAL #6   Title  Pt will improve gait speed to >/= 3.5 ft/sec with LRAD indicating improvement in functional mobility and reduction in fall risk     Time  8    Period  Weeks    Status  Revised      PT LONG TERM GOAL #7   Title  Pt will ambulate 500' out doors navigating uneven terrain, curbs, inclines, and varying gait speed with LRAD at Mod I level to improve safety with community ambulation    Time  8    Period  Weeks    Status  New      PT LONG TERM GOAL #8   Title  Pt will improve SOT composite score to meet age related norms to reduce fall risk potential     Baseline  10/25: 30/65 composite score     Status  New            Plan - 12/25/17 1312     Clinical Impression Statement  Pt progressing well towards goals; carries cane for security but no reliance on cane noted.  Pt has some difficulty with SLS activities due to decreased high level balance skills.      Rehab Potential  Good  Clinical Impairments Affecting Rehab Potential  Chronicity of her condition; may have difficulty finding transportation- indicates she will be calling [facility]     PT Frequency  2x / week    PT Duration  8 weeks    PT Treatment/Interventions  ADLs/Self Care Home Management;DME Instruction;Balance training;Passive range of motion;Neuromuscular re-education;Gait training;Stair training;Functional mobility training;Energy conservation;Patient/family education;Therapeutic activities;Therapeutic exercise    PT Next Visit Plan  cont dynamic standing balance    PT Home Exercise Plan  [id number]    Consulted and Agree with Plan of Care  Patient       Patient will benefit from skilled therapeutic intervention in order to improve the following deficits and impairments:  Decreased knowledge of use of DME, Pain, Decreased coordination, Decreased mobility, Decreased activity tolerance, Decreased endurance, Decreased range of motion, Decreased strength, Decreased balance, Difficulty walking, Decreased skin integrity  Visit Diagnosis: Other abnormalities of gait and mobility  Unsteadiness on feet  Muscle weakness (generalized)     Problem List Patient Active Problem List   Diagnosis Date Noted  . Hyperlipidemia 03/31/2016  . Hypertensive heart disease 03/31/2016  . Personal history of transient ischemic attack (TIA), and cerebral infarction without residual deficits 03/31/2016    Issaac Shipper, [place], PT 12/25/2017, 1:17 PM  [facility] [address] [place], [place], [address] Phone: [phone]   Fax:  [phone]  Name: Christina Austin MRN: [id number] Date of Birth: Jan 16, 1934

## 2017-12-29 ENCOUNTER — Ambulatory Visit: Payer: Medicare Other | Admitting: Physical Therapy

## 2017-12-29 DIAGNOSIS — R2689 Other abnormalities of gait and mobility: Secondary | ICD-10-CM

## 2017-12-29 DIAGNOSIS — R2681 Unsteadiness on feet: Secondary | ICD-10-CM

## 2017-12-29 DIAGNOSIS — M6281 Muscle weakness (generalized): Secondary | ICD-10-CM | POA: Diagnosis not present

## 2017-12-29 NOTE — Patient Instructions (Signed)
Gastroc / Heel Cord Stretch - On Step    Stand with heels over edge of stair. Holding rail, lower heels until stretch is felt in calf of legs. Repeat _1-2__ times. Do _2__ times per day.  Copyright  VHI. All rights reserved.  Gastroc Stretch    Stand with right foot back, leg straight, forward leg bent. Keeping heel on floor, turned slightly out, lean into wall until stretch is felt in calf. Hold _30-45___ seconds. Repeat __1__ times per set. Do __2__ sets per session. Do __2__ sessions per day.  http://orth.exer.us/27   Copyright  VHI. All rights reserved.

## 2017-12-30 NOTE — Therapy (Signed)
[facility] [address] [place], [place], [address] Phone: [phone]   Fax:  [phone]  Physical Therapy Treatment  Patient Details  Name: Christina Austin MRN: [id number] Date of Birth: 04-22-1934 Referring Provider (PT): Dr. Lucianne Lei   Encounter Date: 12/29/2017  PT End of Session - 12/30/17 2050    Visit Number  10    Number of Visits  17    Date for PT Re-Evaluation  12/26/17    Authorization Type  [employer] Medicare- * requires 10th visit PN    PT Start Time  1022    PT Stop Time  1105    PT Time Calculation (min)  43 min       Past Medical History:  Diagnosis Date  . Anxiety   . Arthritis    legs, shoulders   . Benign essential HTN   . Chronic kidney disease, stage III (moderate) (HCC)    chronic kidney disease stage 3  . Depression   . GERD (gastroesophageal reflux disease)   . History of blood transfusion    post hysterectomy  . Hypertensive heart disease without heart failure   . Memory deficit   . Neuromuscular disorder (HCC)    intermittent facial weakness - but at times its severe enough that "I have gone to the hospital", out of state but here in [place]- seen by Dr. Loretta Plume  . Numbness of face   . Other and unspecified hyperlipidemia   . Plantar fasciitis   . Stroke [facility])    TIA  . Unspecified transient cerebral ischemia    takes plavix for 2ndary stroke prevention per Dr. Tomi Likens with neurology    Past Surgical History:  Procedure Laterality Date  . BILATERAL SALPINGOOPHORECTOMY    . BREAST LUMPECTOMY WITH RADIOACTIVE SEED LOCALIZATION Right 08/02/2015   Procedure: BREAST LUMPECTOMY WITH RADIOACTIVE SEED LOCALIZATION;  Surgeon: [name], MD;  Location: [facility];  Service: General;  Laterality: Right;  . colonoscopy     removed polyps- to f/u in 6 months  . TOTAL ABDOMINAL HYSTERECTOMY    . TRIGGER FINGER RELEASE Right 10/21/2016   Procedure: RELEASE TRIGGER FINGER/A-1 PULLEY RIGHT RING  FINGER;  Surgeon: Daryll Brod, MD;  Location: [facility];  Service: Orthopedics;  Laterality: Right;    There were no vitals filed for this visit.  Subjective Assessment - 12/30/17 2045    Subjective  Pt states she has been doing the stretches - has helped the discomfort in her left foot    Pertinent History  Stroke, HTN, depression, HLD, s/p trigger finger release 10/21/16, Reports cataract surgery (2019)    Limitations  Standing;Walking;Lifting    Patient Stated Goals  Improve her functional mobility to reduce overall near fall experiences and pain    Currently in Pain?  No/denies                       [facility] Adult PT Treatment/Exercise - 12/30/17 0001      Self-Care   Self-Care  Other Self-Care Comments    Other Self-Care Comments   Pt inquires about difference between DO and MD:  discuss community fitness opportunities - [place] obtained with recommendation for [employer] class as this incorporates Tai chi and Yoga       Knee/Hip Exercises: Stretches   Active Hamstring Stretch  Both;1 rep;30 seconds    Gastroc Stretch  Both;1 rep;30 seconds   with 2" step  PT Education - 12/30/17 2049    Education Details  info on [facility] - classes and aquatic schedule given to pt; added hamstring & heel cord stretches to HEP    Person(s) Educated  Patient    Methods  Explanation;Demonstration;Handout    Comprehension  Verbalized understanding;Returned demonstration       PT Short Term Goals - 11/24/17 1031      PT SHORT TERM GOAL #1   Title  Pt will participate in establishment of initial HEP for improvement with strength and balance deficits    Baseline  11/12: Reports performing her HEP everyday    Time  4    Period  Weeks    Status  Achieved      PT SHORT TERM GOAL #2   Title  Pt will participate in [place] assessment to establish baseline fall risk potential     Time  4    Period  Weeks    Status  Achieved       PT SHORT TERM GOAL #3   Title  Pt will participate in SOT assessment with LTG as indicated     Baseline  10/25; Composite score of 30/65 for age related norm (79)    Time  4    Period  Weeks    Status  Achieved      PT SHORT TERM GOAL #4   Title  Pt will participate in 6 min walk test to establish baseline value for endurance with LRAD     Time  4    Period  Weeks    Status  Achieved      PT SHORT TERM GOAL #5   Title  Pt will improve 5x STS time to <20 seconds from standard height chair with no UE assist indicating improvement in LE power and reduction in fall risk potential     Baseline  11/12: Pt performs 5x STS from standard height chair with no UE assist in 16.91 seconds    Time  4    Period  Weeks    Status  Achieved      PT SHORT TERM GOAL #6   Title  Pt will improve her gait speed to >/= 2.1 ft/sec with LRAD to improve functional mobility     Baseline  11/12: 3.39 ft/sec with no AD     Time  4    Period  Weeks    Status  Achieved      PT SHORT TERM GOAL #7   Title  Pt will negotiate 8 steps with single HR demonstrating reciprocal ascending and step to descending with supervision to improve safety with home accessibility     Baseline  11/12: Patient performs 8 steps with single HR and reciprocal stepping technique indicating improvement in LE strength and functional mobility    Time  4    Period  Weeks    Status  Achieved      PT SHORT TERM GOAL #8   Title  Pt will ambulate 300' outdoors navigating uneven terrain, curbs, inclines, and demonstrating ability to vary gait speed with LRAD and therapist providing CGA to improve community ambulation   11/12: goal simulated indoors. Patient ambulates 300 feet performing ramps on compliant surface, curb, changes in gait speed, and ambulation over uneven surface requiring CGA to prevent fall. Pt demonstrates x1 LOB post. during amb. on compliant surface   Time  4    Period  Weeks    Status  Achieved  PT Long Term  Goals - 11/24/17 1257      PT LONG TERM GOAL #1   Title  Pt will report independence and compliance with HEP to improve functional mobility     Time  8    Period  Weeks    Status  New      PT LONG TERM GOAL #2   Title  Pt will improve 6 min walk test by 50' indicating improvement in endurance during functional mobility     Time  8    Period  Weeks    Status  New      PT LONG TERM GOAL #3   Title  Pt will improve Berg balance score by 8 points indicating reduction in fall risk potential     Time  8    Period  Weeks    Status  New      PT LONG TERM GOAL #4   Title  Pt will improve 5xSTS to <15 seconds using no UE assist indicating improvement in LE power and reduction in fall risk potential    Time  8    Period  Weeks    Status  New      PT LONG TERM GOAL #5   Title  Pt will negotiate 15 steps with single HR and reciprocal stepping technique with therapist providing supervision demonstrating improvement in LE strength and functional mobility     Time  8    Period  Weeks    Status  New      PT LONG TERM GOAL #6   Title  Pt will improve gait speed to >/= 3.5 ft/sec with LRAD indicating improvement in functional mobility and reduction in fall risk     Time  8    Period  Weeks    Status  Revised      PT LONG TERM GOAL #7   Title  Pt will ambulate 500' out doors navigating uneven terrain, curbs, inclines, and varying gait speed with LRAD at Mod I level to improve safety with community ambulation    Time  8    Period  Weeks    Status  New      PT LONG TERM GOAL #8   Title  Pt will improve SOT composite score to meet age related norms to reduce fall risk potential     Baseline  10/25: 30/65 composite score     Status  New            Plan - 12/30/17 2051    Clinical Impression Statement  This 10th visit progress note covers dates 10-27-17 - 12-29-17; pt is progressing well towards goals with gait and balance improving with pt not using assistive device. Plan D/C next  session.    Rehab Potential  Good    Clinical Impairments Affecting Rehab Potential  Chronicity of her condition; may have difficulty finding transportation- indicates she will be calling [facility]     PT Frequency  2x / week    PT Duration  8 weeks    PT Treatment/Interventions  ADLs/Self Care Home Management;DME Instruction;Balance training;Passive range of motion;Neuromuscular re-education;Gait training;Stair training;Functional mobility training;Energy conservation;Patient/family education;Therapeutic activities;Therapeutic exercise    PT Next Visit Plan  cont dynamic standing balance - D/C next session    Consulted and Agree with Plan of Care  Patient       Patient will benefit from skilled therapeutic intervention in order to improve the following deficits and impairments:  Decreased  knowledge of use of DME, Pain, Decreased coordination, Decreased mobility, Decreased activity tolerance, Decreased endurance, Decreased range of motion, Decreased strength, Decreased balance, Difficulty walking, Decreased skin integrity  Visit Diagnosis: Other abnormalities of gait and mobility  Unsteadiness on feet     Problem List Patient Active Problem List   Diagnosis Date Noted  . Hyperlipidemia 03/31/2016  . Hypertensive heart disease 03/31/2016  . Personal history of transient ischemic attack (TIA), and cerebral infarction without residual deficits 03/31/2016    Amorette Charrette, [place], PT 12/30/2017, 8:54 PM  [facility] [address] [place], [place], [address] Phone: [phone]   Fax:  [phone]  Name: Christina Austin MRN: [id number] Date of Birth: 1934/02/25

## 2018-01-07 ENCOUNTER — Ambulatory Visit: Payer: Medicare Other | Admitting: Physical Therapy

## 2018-01-07 DIAGNOSIS — R2689 Other abnormalities of gait and mobility: Secondary | ICD-10-CM

## 2018-01-07 DIAGNOSIS — M6281 Muscle weakness (generalized): Secondary | ICD-10-CM

## 2018-01-07 DIAGNOSIS — R2681 Unsteadiness on feet: Secondary | ICD-10-CM | POA: Diagnosis not present

## 2018-01-07 NOTE — Patient Instructions (Signed)
Tandem Stance    Right foot in front of left, heel touching toe both feet "straight ahead". Stand on Foot Triangle of Support with both feet. Balance in this position _30__ seconds. Do with left foot in front of right.  (CAN START WITH PARTIAL HEEL TO TOE POSITION)     SINGLE LIMB STANCE    Stance: single leg on floor. Raise leg. Hold _10__ seconds. Repeat with other leg. 2 reps per set, _1-2__ sets per day, __5_ days per week  Be near object or counter for safety to hold as needed!     Practice walking and holding ball overhead to facilitate upright posture during gait

## 2018-01-08 ENCOUNTER — Encounter: Payer: Self-pay | Admitting: Physical Therapy

## 2018-01-08 NOTE — Therapy (Signed)
[place] [address] [place], [place], [address] Phone: [phone]   Fax:  [phone]  Physical Therapy Treatment/Discharge Summary  Patient Details  Name: Christina Austin MRN: [id number] Date of Birth: [birth year] Referring Provider (PT): Dr. Lucianne Lei   Encounter Date: 01/07/2018  PT End of Session - 01/08/18 1935    Visit Number  11    Number of Visits  17    Date for PT Re-Evaluation  12/26/17    Authorization Type  [employer] Medicare- * requires 10th visit PN    PT Start Time  1021    PT Stop Time  1105    PT Time Calculation (min)  44 min       Past Medical History:  Diagnosis Date  . Anxiety   . Arthritis    legs, shoulders   . Benign essential HTN   . Chronic kidney disease, stage III (moderate) (HCC)    chronic kidney disease stage 3  . Depression   . GERD (gastroesophageal reflux disease)   . History of blood transfusion    post hysterectomy  . Hypertensive heart disease without heart failure   . Memory deficit   . Neuromuscular disorder (HCC)    intermittent facial weakness - but at times its severe enough that "I have gone to the hospital", [place] of state but [place] in [place]- seen by Dr. Loretta Plume  . Numbness of face   . Other and unspecified hyperlipidemia   . Plantar fasciitis   . Stroke [facility])    TIA  . Unspecified transient cerebral ischemia    takes plavix for 2ndary stroke prevention per Dr. Tomi Likens with neurology    Past Surgical History:  Procedure Laterality Date  . BILATERAL SALPINGOOPHORECTOMY    . BREAST LUMPECTOMY WITH RADIOACTIVE SEED LOCALIZATION Right 08/02/2015   Procedure: BREAST LUMPECTOMY WITH RADIOACTIVE SEED LOCALIZATION;  Surgeon: [name], MD;  Location: [facility];  Service: General;  Laterality: Right;  . colonoscopy     removed polyps- to f/u in 6 months  . TOTAL ABDOMINAL HYSTERECTOMY    . TRIGGER FINGER RELEASE Right 10/21/2016   Procedure: RELEASE TRIGGER FINGER/A-1  PULLEY RIGHT RING FINGER;  Surgeon: Daryll Brod, MD;  Location: [place];  Service: Orthopedics;  Laterality: Right;    There were no vitals filed for this visit.  Subjective Assessment - 01/08/18 1918    Subjective  Pt states she checked into joining [facility] and plans on joining that center for exercise classes    Pertinent History  Stroke, HTN, depression, HLD, s/p trigger finger release 10/21/16, Reports cataract surgery (2019)    Limitations  Standing;Walking;Lifting    Patient Stated Goals  Improve her functional mobility to reduce overall near fall experiences and pain    Currently in Pain?  No/denies         [facility] PT Assessment - 01/08/18 0001      Berg Balance Test   Sit to Stand  Able to stand without using hands and stabilize independently    Standing Unsupported  Able to stand safely 2 minutes    Sitting with Back Unsupported but Feet Supported on Floor or Stool  Able to sit safely and securely 2 minutes    Stand to Sit  Sits safely with minimal use of hands    Transfers  Able to transfer safely, minor use of hands    Standing Unsupported with Eyes Closed  Able to stand 10 seconds safely  Standing Ubsupported with Feet Together  Able to place feet together independently and stand 1 minute safely    From Standing, Reach Forward with Outstretched Arm  Can reach confidently >25 cm (10")    From Standing Position, Pick up Object from [place] to pick up shoe safely and easily    From Standing Position, Turn to Look Behind Over each Shoulder  Looks behind from both sides and weight shifts well    Turn 360 Degrees  Able to turn 360 degrees safely in 4 seconds or less    Standing Unsupported, Alternately Place Feet on Step/Stool  Able to stand independently and safely and complete 8 steps in 20 seconds    Standing Unsupported, One Foot in Front  Able to plae foot ahead of the other independently and hold 30 seconds    Standing on One Leg  Able to  lift leg independently and hold > 10 seconds    Total Score  55                   [facility] Adult PT Treatment/Exercise - 01/08/18 0001      Transfers   Transfers  Sit to Stand    Five time sit to stand comments   13.00 secs; with no UE support from mat    Number of Reps  Other reps (comment)   5     Ambulation/Gait   Ambulation/Gait  Yes    Assistive device  None    Gait Pattern  Within Functional Limits    Gait velocity  11.66, 8.84 secs = 3.71 ft/sec    Stairs  Yes    Stairs Assistance  5: Supervision    Stair Management Technique  One rail Right;Alternating pattern;Forwards    Number of Stairs  4    Height of Stairs  6      TherAct:  Practiced floor to stand transfers with UE support on mat surface - pt performed transfer 2 reps with SBA And verbal cues for correct positioning on 1st rep only         PT Short Term Goals - 11/24/17 1031      PT SHORT TERM GOAL #1   Title  Pt will participate in establishment of initial HEP for improvement with strength and balance deficits    Baseline  11/12: Reports performing her HEP everyday    Time  4    Period  Weeks    Status  Achieved      PT SHORT TERM GOAL #2   Title  Pt will participate in [place] assessment to establish baseline fall risk potential     Time  4    Period  Weeks    Status  Achieved      PT SHORT TERM GOAL #3   Title  Pt will participate in SOT assessment with LTG as indicated     Baseline  10/25; Composite score of 30/65 for age related norm (79)    Time  4    Period  Weeks    Status  Achieved      PT SHORT TERM GOAL #4   Title  Pt will participate in 6 min walk test to establish baseline value for endurance with LRAD     Time  4    Period  Weeks    Status  Achieved      PT SHORT TERM GOAL #5   Title  Pt will improve 5x STS time to <  20 seconds from standard height chair with no UE assist indicating improvement in LE power and reduction in fall risk potential     Baseline  11/12:  Pt performs 5x STS from standard height chair with no UE assist in 16.91 seconds    Time  4    Period  Weeks    Status  Achieved      PT SHORT TERM GOAL #6   Title  Pt will improve her gait speed to >/= 2.1 ft/sec with LRAD to improve functional mobility     Baseline  11/12: 3.39 ft/sec with no AD     Time  4    Period  Weeks    Status  Achieved      PT SHORT TERM GOAL #7   Title  Pt will negotiate 8 steps with single HR demonstrating reciprocal ascending and step to descending with supervision to improve safety with home accessibility     Baseline  11/12: Patient performs 8 steps with single HR and reciprocal stepping technique indicating improvement in LE strength and functional mobility    Time  4    Period  Weeks    Status  Achieved      PT SHORT TERM GOAL #8   Title  Pt will ambulate 300' outdoors navigating uneven terrain, curbs, inclines, and demonstrating ability to vary gait speed with LRAD and therapist providing CGA to improve community ambulation   11/12: goal simulated indoors. Patient ambulates 300 feet performing ramps on compliant surface, curb, changes in gait speed, and ambulation over uneven surface requiring CGA to prevent fall. Pt demonstrates x1 LOB post. during amb. on compliant surface   Time  4    Period  Weeks    Status  Achieved        PT Long Term Goals - 01/07/18 1023      PT LONG TERM GOAL #1   Title  Pt will report independence and compliance with HEP to improve functional mobility     Status  Achieved      PT LONG TERM GOAL #2   Title  Pt will improve 6 min walk test by 50' indicating improvement in endurance during functional mobility     Baseline  not tested due to time constraint - 01-07-18    Status  Deferred      PT LONG TERM GOAL #3   Title  Pt will improve Berg balance score by 8 points indicating reduction in fall risk potential     Baseline  47/56 on 11-11-17;  55/56 on 01-07-18      PT LONG TERM GOAL #4   Title  Pt will improve  5xSTS to <15 seconds using no UE assist indicating improvement in LE power and reduction in fall risk potential    Baseline  13.00 secs with no UE support - 01-07-18    Status  Achieved      PT LONG TERM GOAL #5   Title  Pt will negotiate 15 steps with single HR and reciprocal stepping technique with therapist providing supervision demonstrating improvement in LE strength and functional mobility     Baseline  met 01-07-18    Status  Achieved      PT LONG TERM GOAL #6   Title  Pt will improve gait speed to >/= 3.5 ft/sec with LRAD indicating improvement in functional mobility and reduction in fall risk     Baseline  11.66, 8.84 = 3.71 ft/sec    Time  8      PT LONG TERM GOAL #7   Title  Pt will ambulate 500' out doors navigating uneven terrain, curbs, inclines, and varying gait speed with LRAD at Mod I level to improve safety with community ambulation    Baseline  Pt reports she does not walk on grassy terrrain unless someone is with her    Status  Partially Met      PT LONG TERM GOAL #8   Title  Pt will improve SOT composite score to meet age related norms to reduce fall risk potential     Baseline  NT due to time constraint - 01-07-18    Status  Deferred            Plan - 01/08/18 1936    Clinical Impression Statement  Pt has met LTG's #1, 3-6: LTG #2 & 8 not tested due to time constraint; LTG #7 is partially met as pt states she does not ambulate on grassy terrain due to fear of falling; pt has made good progress with improvements in balance and gait velocity achieved.  Pt reports she plans on joining [facility] for continuation of exercise program.      Rehab Potential  Good    Clinical Impairments Affecting Rehab Potential  Chronicity of her condition; may have difficulty finding transportation- indicates she will be calling [facility]     PT Frequency  2x / week    PT Duration  8 weeks    PT Treatment/Interventions  ADLs/Self Care Home Management;DME Instruction;Balance  training;Passive range of motion;Neuromuscular re-education;Gait training;Stair training;Functional mobility training;Energy conservation;Patient/family education;Therapeutic activities;Therapeutic exercise    PT Next Visit Plan  N/A - D/C    PT Home Exercise Plan  [id number]    Consulted and Agree with Plan of Care  Patient       Patient will benefit from skilled therapeutic intervention in order to improve the following deficits and impairments:  Decreased knowledge of use of DME, Pain, Decreased coordination, Decreased mobility, Decreased activity tolerance, Decreased endurance, Decreased range of motion, Decreased strength, Decreased balance, Difficulty walking, Decreased skin integrity  Visit Diagnosis: Other abnormalities of gait and mobility  Unsteadiness on feet  Muscle weakness (generalized)     Problem List Patient Active Problem List   Diagnosis Date Noted  . Hyperlipidemia 03/31/2016  . Hypertensive heart disease 03/31/2016  . Personal history of transient ischemic attack (TIA), and cerebral infarction without residual deficits 03/31/2016     PHYSICAL THERAPY DISCHARGE SUMMARY  Visits from Start of Care: 11  Current functional level related to goals / functional outcomes: See above for progress towards goals - majority of goals have been achieved; pt has made excellent progress   Remaining deficits: Minimally decreased high level balance skills   Education / Equipment: Pt has been instructed in HEP for balance and strengthening/stretching:  Pt has been given info on [facility] (as this is facility which pt states she plans on joining)  Plan: Patient agrees to discharge.  Patient goals were partially met. Patient is being discharged due to meeting the stated rehab goals.  ?????        Alda Lea, PT 01/08/2018, 7:41 PM  [place] [address] [place], [place], [address] Phone:  [phone]   Fax:  [phone]  Name: Christina Austin MRN: [id number] Date of Birth: 07-05-1934

## 2018-01-20 DIAGNOSIS — M130 Polyarthritis, unspecified: Secondary | ICD-10-CM | POA: Diagnosis not present

## 2018-01-20 DIAGNOSIS — I10 Essential (primary) hypertension: Secondary | ICD-10-CM | POA: Diagnosis not present

## 2018-01-20 DIAGNOSIS — E782 Mixed hyperlipidemia: Secondary | ICD-10-CM | POA: Diagnosis not present

## 2018-04-28 DIAGNOSIS — E782 Mixed hyperlipidemia: Secondary | ICD-10-CM | POA: Diagnosis not present

## 2018-04-28 DIAGNOSIS — M130 Polyarthritis, unspecified: Secondary | ICD-10-CM | POA: Diagnosis not present

## 2018-04-28 DIAGNOSIS — I10 Essential (primary) hypertension: Secondary | ICD-10-CM | POA: Diagnosis not present

## 2018-06-30 DIAGNOSIS — I10 Essential (primary) hypertension: Secondary | ICD-10-CM | POA: Diagnosis not present

## 2018-06-30 DIAGNOSIS — M130 Polyarthritis, unspecified: Secondary | ICD-10-CM | POA: Diagnosis not present

## 2018-06-30 DIAGNOSIS — E782 Mixed hyperlipidemia: Secondary | ICD-10-CM | POA: Diagnosis not present

## 2018-09-16 DIAGNOSIS — N390 Urinary tract infection, site not specified: Secondary | ICD-10-CM | POA: Diagnosis not present

## 2018-09-16 DIAGNOSIS — N819 Female genital prolapse, unspecified: Secondary | ICD-10-CM | POA: Diagnosis not present

## 2018-09-30 DIAGNOSIS — M320 Drug-induced systemic lupus erythematosus: Secondary | ICD-10-CM | POA: Diagnosis not present

## 2018-09-30 DIAGNOSIS — M130 Polyarthritis, unspecified: Secondary | ICD-10-CM | POA: Diagnosis not present

## 2018-09-30 DIAGNOSIS — I10 Essential (primary) hypertension: Secondary | ICD-10-CM | POA: Diagnosis not present

## 2018-09-30 DIAGNOSIS — N320 Bladder-neck obstruction: Secondary | ICD-10-CM | POA: Diagnosis not present

## 2018-09-30 DIAGNOSIS — E782 Mixed hyperlipidemia: Secondary | ICD-10-CM | POA: Diagnosis not present

## 2018-10-01 DIAGNOSIS — M25562 Pain in left knee: Secondary | ICD-10-CM | POA: Diagnosis not present

## 2018-10-01 DIAGNOSIS — M25572 Pain in left ankle and joints of left foot: Secondary | ICD-10-CM | POA: Diagnosis not present

## 2018-10-01 DIAGNOSIS — M25571 Pain in right ankle and joints of right foot: Secondary | ICD-10-CM | POA: Diagnosis not present

## 2018-10-01 DIAGNOSIS — M25561 Pain in right knee: Secondary | ICD-10-CM | POA: Diagnosis not present

## 2018-10-14 DIAGNOSIS — K5902 Outlet dysfunction constipation: Secondary | ICD-10-CM | POA: Diagnosis not present

## 2018-10-14 DIAGNOSIS — M6289 Other specified disorders of muscle: Secondary | ICD-10-CM | POA: Diagnosis not present

## 2018-10-14 DIAGNOSIS — N3281 Overactive bladder: Secondary | ICD-10-CM | POA: Diagnosis not present

## 2018-10-14 DIAGNOSIS — N952 Postmenopausal atrophic vaginitis: Secondary | ICD-10-CM | POA: Diagnosis not present

## 2018-10-15 ENCOUNTER — Other Ambulatory Visit: Payer: Self-pay

## 2018-10-15 DIAGNOSIS — Z20822 Contact with and (suspected) exposure to covid-19: Secondary | ICD-10-CM

## 2018-10-16 LAB — NOVEL CORONAVIRUS, NAA: SARS-CoV-2, NAA: NOT DETECTED

## 2018-10-18 ENCOUNTER — Encounter: Payer: Self-pay | Admitting: Psychology

## 2018-10-20 DIAGNOSIS — M7661 Achilles tendinitis, right leg: Secondary | ICD-10-CM | POA: Diagnosis not present

## 2018-10-20 DIAGNOSIS — M76821 Posterior tibial tendinitis, right leg: Secondary | ICD-10-CM | POA: Diagnosis not present

## 2018-10-20 DIAGNOSIS — M76822 Posterior tibial tendinitis, left leg: Secondary | ICD-10-CM | POA: Diagnosis not present

## 2018-10-20 DIAGNOSIS — M7662 Achilles tendinitis, left leg: Secondary | ICD-10-CM | POA: Diagnosis not present

## 2018-10-22 ENCOUNTER — Ambulatory Visit: Payer: Medicare Other | Attending: Obstetrics and Gynecology | Admitting: Physical Therapy

## 2018-10-22 ENCOUNTER — Encounter: Payer: Self-pay | Admitting: Physical Therapy

## 2018-10-22 ENCOUNTER — Other Ambulatory Visit: Payer: Self-pay

## 2018-10-22 DIAGNOSIS — M6281 Muscle weakness (generalized): Secondary | ICD-10-CM | POA: Diagnosis not present

## 2018-10-22 DIAGNOSIS — R252 Cramp and spasm: Secondary | ICD-10-CM | POA: Diagnosis not present

## 2018-10-22 DIAGNOSIS — R279 Unspecified lack of coordination: Secondary | ICD-10-CM | POA: Diagnosis not present

## 2018-10-22 NOTE — Patient Instructions (Signed)
Moisturizers . They are used in the vagina to hydrate the mucous membrane that make up the vaginal canal. . Designed to keep a more normal acid balance (ph) . Once placed in the vagina, it will last between two to three days.  . Use 2-3 times per week at bedtime  . Ingredients to avoid is glycerin and fragrance, can increase chance of infection . Should not be used just before sex due to causing irritation . Most are gels administered either in a tampon-shaped applicator or as a vaginal suppository. They are non-hormonal.   Types of Moisturizers  . Vitamin E vaginal suppositories- Whole foods, Amazon . Moist Again . Coconut oil- can break down condoms . Julva- (Do no use if on Tamoxifen) amazon . Yes moisturizer- amazon . NeuEve Silk , NeuEve Silver for menopausal or over 65 (if have severe vaginal atrophy or cancer treatments use NeuEve Silk for  1 month than move to NeuEve Silver)- Amazon, Neuve.com . Olive and Bee intimate cream- www.oliveandbee.com.au . Mae vaginal moisturizer- Amazon . Aloe .    Creams to use externally on the Vulva area  Desert Harvest Releveum (good for for cancer patients that had radiation to the area)- amazon or www.desertharvest.com  V-magic cream - amazon  Julva-amazon  Vital "V Wild Yam salve ( help moisturize and help with thinning vulvar area, does have Beeswax  MoodMaid Botanical Pro-Meno Wild Yam Cream- Amazon  Desert Harvest Gele  Cleo by Damiva labial moisturizer (Amazon,   Coconut or olive oil  aloe   Things to avoid in the vaginal area . Do not use things to irritate the vulvar area . No lotions just specialized creams for the vulva area- Neogyn, V-magic, No soaps; can use Aveeno or Calendula cleanser if needed. Must be gentle . No deodorants . No douches . Good to sleep without underwear to let the vaginal area to air out . No scrubbing: spread the lips to let warm water rinse over labias and pat dry  [facility] [address], [address] [place], [place] [address] Phone # [phone] Fax [phone]   

## 2018-10-24 NOTE — Therapy (Signed)
[facility] [address] [place], [place], [address] Phone: [phone]   Fax:  [phone]  Physical Therapy Evaluation  Patient Details  Name: CARIN ASKELAND MRN: [id number] Date of Birth: 1934-05-01 Referring Provider (PT): Jasper Loser, MD   Encounter Date: 10/22/2018  PT End of Session - 10/24/18 1631    Visit Number  1    Date for PT Re-Evaluation  12/17/18    PT Start Time  0848    PT Stop Time  0928    PT Time Calculation (min)  40 min    Activity Tolerance  Patient tolerated treatment well    Behavior During Therapy  [facility] for tasks assessed/performed       Past Medical History:  Diagnosis Date  . Anxiety   . Arthritis    legs, shoulders   . Benign essential HTN   . Chronic kidney disease, stage III (moderate)    chronic kidney disease stage 3  . Depression   . GERD (gastroesophageal reflux disease)   . History of blood transfusion    post hysterectomy  . Hypertensive heart disease without heart failure   . Memory deficit   . Neuromuscular disorder (HCC)    intermittent facial weakness - but at times its severe enough that "I have gone to the hospital", [place] but here in [place]- seen by Dr. Loretta Plume  . Numbness of face   . Other and unspecified hyperlipidemia   . Plantar fasciitis   . Stroke [facility])    TIA  . Unspecified transient cerebral ischemia    takes plavix for 2ndary stroke prevention per Dr. Tomi Likens with neurology    Past Surgical History:  Procedure Laterality Date  . BILATERAL SALPINGOOPHORECTOMY    . BREAST LUMPECTOMY WITH RADIOACTIVE SEED LOCALIZATION Right 08/02/2015   Procedure: BREAST LUMPECTOMY WITH RADIOACTIVE SEED LOCALIZATION;  Surgeon: [name], MD;  Location: [facility];  Service: General;  Laterality: Right;  . colonoscopy     removed polyps- to f/u in 6 months  . TOTAL ABDOMINAL HYSTERECTOMY    . TRIGGER FINGER RELEASE Right 10/21/2016   Procedure: RELEASE TRIGGER  FINGER/A-1 PULLEY RIGHT RING FINGER;  Surgeon: Daryll Brod, MD;  Location: [facility];  Service: Orthopedics;  Laterality: Right;    There were no vitals filed for this visit.   Subjective Assessment - 10/24/18 1636    Subjective  Pt states she has been having some wetness throuhgout the day for a while and changes panty liner every time she uses the bathroom.  States she is constipated and takes mirilax. Rarely pushes hard but has to use the mirilax.    Patient Stated Goals  get rid of pain and stop having leakage    Currently in Pain?  Yes    Pain Score  3    up to 9/10   Pain Location  Abdomen    Pain Orientation  Medial;Lateral    Pain Descriptors / Indicators  Discomfort;Pressure    Pain Type  Chronic pain    Pain Radiating Towards  around the back    Pain Onset  More than a month ago    Pain Frequency  Intermittent    Aggravating Factors   not sure    Pain Relieving Factors  lie down, tylenol    Multiple Pain Sites  No         [facility] PT Assessment - 10/24/18 0001      Assessment  Medical Diagnosis  N95.2 (ICD-10-CM) - Postmenopausal atrophic vaginitis; M62.89 (ICD-10-CM) - Other specified disorders of muscle    Referring Provider (PT)  Jasper Loser, MD    Prior Therapy  no      Precautions   Precautions  None      Restrictions   Weight Bearing Restrictions  No      Balance Screen   Has the patient fallen in the past 6 months  No      [place] residence    Living Arrangements  Alone      Prior Function   Level of Independence  Independent      Cognition   Overall Cognitive Status  Within Functional Limits for tasks assessed      Observation/Other Assessments   Observations  lipoma around Rt rhomboids      Posture/Postural Control   Posture/Postural Control  Postural limitations    Postural Limitations  Decreased lumbar lordosis;Decreased thoracic kyphosis;Forward head;Rounded Shoulders      ROM /  Strength   AROM / PROM / Strength  Strength      Strength   Overall Strength Comments  bilateral LE grossly 4/5      Flexibility   Soft Tissue Assessment /Muscle Length  yes    Hamstrings  80%      Palpation   Palpation comment  TTP pubic symphasis      Ambulation/Gait   Gait Pattern  Decreased stride length;Decreased step length - right;Decreased step length - left                Objective measurements completed on examination: See above findings.    Pelvic Floor Special Questions - 10/24/18 0001    Prior Pelvic/Prostate Exam  Yes    Prior Pregnancies  Yes    Number of Pregnancies  4    Currently Sexually Active  No   widowed   Urinary Leakage  Yes    Pad use  every time goes to the bathroom -5-7/day panty liner    Fecal incontinence  No   constipated   Falling out feeling (prolapse)  Yes    Activities that cause feeling of prolapse  unknown    Skin Integrity  Intact;Other    Skin Integrity other  dryness    Perineal Body/Introitus   Descended    Prolapse  Anterior Wall    Pelvic Floor Internal Exam  pt identity confirmed and informed consent given to perfrom soft tissue assessment and treatment    Exam Type  Vaginal    Palpation  TTP Lt>Rt    Strength  weak squeeze, no lift    Strength # of reps  1    Strength # of seconds  1    Tone  low       [facility] Adult PT Treatment/Exercise - 10/24/18 0001      Self-Care   Self-Care  Other Self-Care Comments    Other Self-Care Comments   moisturizers and how to apply             PT Education - 10/24/18 1637    Education Details  moisturizers    Person(s) Educated  Patient    Methods  Explanation;Handout    Comprehension  Verbalized understanding       PT Short Term Goals - 10/22/18 0933      PT SHORT TERM GOAL #1   Title  Pt will participate in establishment of initial HEP for improvement  with strength deficits    Time  4    Period  Weeks    Status  New    Target Date  11/19/18        PT  Long Term Goals - 10/22/18 0934      PT LONG TERM GOAL #1   Title  ind with advanced HEP    Time  8    Period  Weeks    Status  New    Target Date  12/17/18      PT LONG TERM GOAL #2   Title  Pt will report feeling at least 50% less wetness throughout the day    Time  8    Period  Weeks    Status  New    Target Date  12/17/18      PT LONG TERM GOAL #3   Title  Pt will report 50% less vaginal and abdominal pain    Time  8    Period  Weeks    Status  New    Target Date  12/17/18             Plan - 10/24/18 1647    Clinical Impression Statement  Pt presents to skilled PT due to abdominal and back pain as well as pain that radiates in to pelvic floor.  She has constant wetness that she wears panty liners to manage and constipation.  Pt has posture abnormalities, LE weakness and decreased flexibility.  pt also has pelvic floor weakness as mentioned above.  She will benefit from addressing these impairments in order to better mange pain and bowel and bladder control.    Personal Factors and Comorbidities  Age;Comorbidity 3+    Comorbidities  total hysterectomy, >2 vaginal deliveries, chronic pain    Examination-Activity Limitations  Continence;Toileting    Examination-Participation Restrictions  Community Activity    Stability/Clinical Decision Making  Evolving/Moderate complexity    Clinical Decision Making  Moderate    Rehab Potential  Excellent    PT Frequency  2x / week    PT Duration  8 weeks    PT Treatment/Interventions  ADLs/Self Care Home Management;Biofeedback;Cryotherapy;Electrical Stimulation;Moist Heat;Therapeutic exercise;Therapeutic activities;Neuromuscular re-education;Patient/family education;Manual techniques;Taping;Dry needling;Passive range of motion    PT Next Visit Plan  biofeedback    PT Home Exercise Plan  f/u on moisturizing    Consulted and Agree with Plan of Care  Patient       Patient will benefit from skilled therapeutic intervention in order to  improve the following deficits and impairments:  Pain, Postural dysfunction, Impaired flexibility, Decreased coordination, Decreased strength  Visit Diagnosis: Muscle weakness (generalized)  Cramp and spasm  Unspecified lack of coordination     Problem List Patient Active Problem List   Diagnosis Date Noted  . Hyperlipidemia 03/31/2016  . Hypertensive heart disease 03/31/2016  . Personal history of transient ischemic attack (TIA), and cerebral infarction without residual deficits 03/31/2016    Jule Ser, PT 10/24/2018, 4:59 PM  [facility] [address] [place], [place], [address] Phone: [phone]   Fax:  [phone]  Name: AALLIYAH RICKETTS MRN: [id number] Date of Birth: [birth year]

## 2018-10-25 DIAGNOSIS — M76822 Posterior tibial tendinitis, left leg: Secondary | ICD-10-CM | POA: Diagnosis not present

## 2018-10-25 DIAGNOSIS — M7661 Achilles tendinitis, right leg: Secondary | ICD-10-CM | POA: Diagnosis not present

## 2018-10-26 ENCOUNTER — Ambulatory Visit: Payer: Medicare Other | Admitting: Physical Therapy

## 2018-10-29 DIAGNOSIS — M76822 Posterior tibial tendinitis, left leg: Secondary | ICD-10-CM | POA: Diagnosis not present

## 2018-10-29 DIAGNOSIS — M7661 Achilles tendinitis, right leg: Secondary | ICD-10-CM | POA: Diagnosis not present

## 2018-11-01 DIAGNOSIS — M7661 Achilles tendinitis, right leg: Secondary | ICD-10-CM | POA: Diagnosis not present

## 2018-11-01 DIAGNOSIS — M76822 Posterior tibial tendinitis, left leg: Secondary | ICD-10-CM | POA: Diagnosis not present

## 2018-11-02 ENCOUNTER — Ambulatory Visit: Payer: Medicare Other | Admitting: Physical Therapy

## 2018-11-02 ENCOUNTER — Other Ambulatory Visit: Payer: Self-pay

## 2018-11-02 ENCOUNTER — Encounter: Payer: Self-pay | Admitting: Physical Therapy

## 2018-11-02 DIAGNOSIS — R252 Cramp and spasm: Secondary | ICD-10-CM | POA: Diagnosis not present

## 2018-11-02 DIAGNOSIS — M6281 Muscle weakness (generalized): Secondary | ICD-10-CM

## 2018-11-02 DIAGNOSIS — R279 Unspecified lack of coordination: Secondary | ICD-10-CM | POA: Diagnosis not present

## 2018-11-02 NOTE — Therapy (Signed)
[facility] [address], [place], [place], [address] Phone: [phone]   Fax:  [phone]  Physical Therapy Treatment  Patient Details  Name: Christina Austin MRN: [id number] Date of Birth: 1934/07/23 Referring Provider (PT): Jasper Loser, MD   Encounter Date: 11/02/2018  PT End of Session - 11/02/18 1145    Visit Number  2    Date for PT Re-Evaluation  12/17/18    PT Start Time  9407    PT Stop Time  1232    PT Time Calculation (min)  47 min    Activity Tolerance  Patient tolerated treatment well    Behavior During Therapy  [facility] for tasks assessed/performed       Past Medical History:  Diagnosis Date  . Anxiety   . Arthritis    legs, shoulders   . Benign essential HTN   . Chronic kidney disease, stage III (moderate)    chronic kidney disease stage 3  . Depression   . GERD (gastroesophageal reflux disease)   . History of blood transfusion    post hysterectomy  . Hypertensive heart disease without heart failure   . Memory deficit   . Neuromuscular disorder (HCC)    intermittent facial weakness - but at times its severe enough that "I have gone to the hospital", out of state but here in [place]- seen by Dr. Loretta Plume  . Numbness of face   . Other and unspecified hyperlipidemia   . Plantar fasciitis   . Stroke [facility])    TIA  . Unspecified transient cerebral ischemia    takes plavix for 2ndary stroke prevention per Dr. Tomi Likens with neurology    Past Surgical History:  Procedure Laterality Date  . BILATERAL SALPINGOOPHORECTOMY    . BREAST LUMPECTOMY WITH RADIOACTIVE SEED LOCALIZATION Right 08/02/2015   Procedure: BREAST LUMPECTOMY WITH RADIOACTIVE SEED LOCALIZATION;  Surgeon: [name], MD;  Location: [place];  Service: General;  Laterality: Right;  . colonoscopy     removed polyps- to f/u in 6 months  . TOTAL ABDOMINAL HYSTERECTOMY    . TRIGGER FINGER RELEASE Right 10/21/2016   Procedure: RELEASE TRIGGER  FINGER/A-1 PULLEY RIGHT RING FINGER;  Surgeon: Daryll Brod, MD;  Location: [place];  Service: Orthopedics;  Laterality: Right;    There were no vitals filed for this visit.  Subjective Assessment - 11/02/18 1148    Subjective  Pt has been making a "pudding" with equal parts of apple sauce, wheat bran, and prune juice and states it has helped to take 1 table spoon per day.    Patient Stated Goals  get rid of pain and stop having leakage    Currently in Pain?  Yes    Pain Location  Abdomen    Pain Orientation  Medial;Right    Pain Descriptors / Indicators  Discomfort    Pain Type  Chronic pain    Pain Onset  More than a month ago    Pain Frequency  Intermittent    Aggravating Factors   have to go to the bathroom or when constipated    Multiple Pain Sites  No                       [facility] Adult PT Treatment/Exercise - 11/02/18 0001      Self-Care   Other Self-Care Comments   abdominal massage techniques      Neuro Re-ed    Neuro Re-ed Details  tactile cues for pelvic floor control, breathing, and TrA activation      Exercises   Exercises  Lumbar      Lumbar Exercises: Supine   Other Supine Lumbar Exercises  kegel and ball squeeze with kegel - 3-4 second holding - cues for breathing, tactile cues to lift pelvic floor, tactile cues for TrA activation      Manual Therapy   Manual Therapy  Myofascial release    Manual therapy comments  abdominal fascial release               PT Short Term Goals - 10/22/18 0933      PT SHORT TERM GOAL #1   Title  Pt will participate in establishment of initial HEP for improvement with strength deficits    Time  4    Period  Weeks    Status  New    Target Date  11/19/18        PT Long Term Goals - 10/22/18 0934      PT LONG TERM GOAL #1   Title  ind with advanced HEP    Time  8    Period  Weeks    Status  New    Target Date  12/17/18      PT LONG TERM GOAL #2   Title  Pt will report feeling  at least 50% less wetness throughout the day    Time  8    Period  Weeks    Status  New    Target Date  12/17/18      PT LONG TERM GOAL #3   Title  Pt will report 50% less vaginal and abdominal pain    Time  8    Period  Weeks    Status  New    Target Date  12/17/18            Plan - 11/02/18 1244    Clinical Impression Statement  Pt at first visit since eval and no goals met yet.  Pt required maximum verbal and tactile cues for kegel exercises.  She was also educated on abdominal massage to work on improved mobility of stool through the large intestine.  Pt reports she was a little more sore after abdominal fascial release.  Pt will continue to benefit from skilled PT to work on pelvic floor strength for improved bowel and bladder control.    PT Treatment/Interventions  ADLs/Self Care Home Management;Biofeedback;Cryotherapy;Electrical Stimulation;Moist Heat;Therapeutic exercise;Therapeutic activities;Neuromuscular re-education;Patient/family education;Manual techniques;Taping;Dry needling;Passive range of motion    PT Next Visit Plan  biofeedback    PT Home Exercise Plan  Access Code: [email or web address] URL: [email or web address] Date: 11/02/2018 Prepared by: Jari Favre  Exercises Supine Pelvic Floor Contraction - 10 reps - 1 sets - 3 sec hold - 3x daily - 7x weekly    Consulted and Agree with Plan of Care  Patient       Patient will benefit from skilled therapeutic intervention in order to improve the following deficits and impairments:  Pain, Postural dysfunction, Impaired flexibility, Decreased coordination, Decreased strength  Visit Diagnosis: Muscle weakness (generalized)  Cramp and spasm  Unspecified lack of coordination     Problem List Patient Active Problem List   Diagnosis Date Noted  . Hyperlipidemia 03/31/2016  . Hypertensive heart disease 03/31/2016  . Personal history of transient ischemic attack (TIA), and cerebral infarction without  residual deficits 03/31/2016    Jule Ser, PT 11/02/2018, 12:58 PM  [facility]  [address], [place], [place], [address] Phone: [phone]   Fax:  [phone]  Name: Christina Austin MRN: [id number] Date of Birth: [birth year]

## 2018-11-02 NOTE — Patient Instructions (Addendum)
About Abdominal Massage  Abdominal massage, also called external colon massage, is a self-treatment circular massage technique that can reduce and eliminate gas and ease constipation. The colon naturally contracts in waves in a clockwise direction starting from inside the right hip, moving up toward the ribs, across the belly, and down inside the left hip.  When you perform circular abdominal massage, you help stimulate your colon's normal wave pattern of movement called peristalsis.  It is most beneficial when done after eating.  Positioning You can practice abdominal massage with oil while lying down, or in the shower with soap.  Some people find that it is just as effective to do the massage through clothing while sitting or standing.  How to Massage Start by placing your finger tips or knuckles on your right side, just inside your hip bone.  . Make small circular movements while you move upward toward your rib cage.   . Once you reach the bottom right side of your rib cage, take your circular movements across to the left side of the bottom of your rib cage.  . Next, move downward until you reach the inside of your left hip bone.  This is the path your feces travel in your colon. . Continue to perform your abdominal massage in this pattern for 10 minutes each day.     You can apply as much pressure as is comfortable in your massage.  Start gently and build pressure as you continue to practice.  Notice any areas of pain as you massage; areas of slight pain may be relieved as you massage, but if you have areas of significant or intense pain, consult with your healthcare provider.  Other Considerations . General physical activity including bending and stretching can have a beneficial massage-like effect on the colon.  Deep breathing can also stimulate the colon because breathing deeply activates the same nervous system that supplies the colon.   . Abdominal massage should always be used in  combination with a bowel-conscious diet that is high in the proper type of fiber for you, fluids (primarily water), and a regular exercise program. [facility] [address], [address] [place], [place] [address] Phone # [phone] Fax [phone]  

## 2018-11-04 ENCOUNTER — Ambulatory Visit: Payer: Medicare Other | Admitting: Physical Therapy

## 2018-11-04 ENCOUNTER — Other Ambulatory Visit: Payer: Self-pay

## 2018-11-04 DIAGNOSIS — R252 Cramp and spasm: Secondary | ICD-10-CM | POA: Diagnosis not present

## 2018-11-04 DIAGNOSIS — M6281 Muscle weakness (generalized): Secondary | ICD-10-CM | POA: Diagnosis not present

## 2018-11-04 DIAGNOSIS — R279 Unspecified lack of coordination: Secondary | ICD-10-CM

## 2018-11-04 NOTE — Patient Instructions (Addendum)
Types of Fiber  There are two main types of fiber:  insoluble and soluble.  Both of these types can prevent and relieve constipation and diarrhea, although some people find one or the other to be more easily digested.  This handout details information about both types of fiber.  Insoluble Fiber       Functions of Insoluble Fiber . moves bulk through the intestines  . controls and balances the pH (acidity) in the intestines       Benefits of Insoluble Fiber . promotes regular bowel movement and prevents constipation  . removes fecal waste through colon in less time  . keeps an optimal pH in intestines to prevent microbes from producing cancer substances, therefore preventing colon cancer        Food Sources of Insoluble Fiber . whole-wheat products  . wheat bran "miller's bran" . corn bran  . flax seed or other seeds . vegetables such as green beans, broccoli, cauliflower and potato skins  . fruit skins and root vegetable skins  . popcorn . brown rice  Soluble Fiber       Functions of Soluble Fiber  . holds water in the colon to bulk and soften the stool . prolongs stomach emptying time so that sugar is released and absorbed more slowly        Benefits of Soluble Fiber . lowers total cholesterol and LDL cholesterol (the bad cholesterol) therefore reducing the risk of heart disease  . regulates blood sugar for people with diabetes       Food Sources of Soluble Fiber . oat/oat bran . dried beans and peas  . nuts  . barley  . flax seed or other seeds . fruits such as oranges, pears, peaches, and apples  . vegetables such as carrots  . psyllium husk  . prunes Access Code: [email or web address]  URL: [email or web address]  Date: 11/04/2018  Prepared by: Jari Favre   Exercises  Supine Pelvic Floor Contraction - 10 reps - 1 sets - 3 sec hold - 3x daily - 7x weekly  Seated Pelvic Floor Contraction - 10 reps - 3 sets - 1x daily - 7x weekly

## 2018-11-04 NOTE — Therapy (Signed)
[facility] [address], [place], [place], [address] Phone: [phone]   Fax:  [phone]  Physical Therapy Treatment  Patient Details  Name: Christina Austin MRN: [id number] Date of Birth: [birth year] Referring Provider (PT): Jasper Loser, MD   Encounter Date: 11/04/2018  PT End of Session - 11/04/18 1252    Visit Number  3    Date for PT Re-Evaluation  12/17/18    PT Start Time  1100    PT Stop Time  1142    PT Time Calculation (min)  42 min    Activity Tolerance  Patient tolerated treatment well    Behavior During Therapy  [facility] for tasks assessed/performed       Past Medical History:  Diagnosis Date  . Anxiety   . Arthritis    legs, shoulders   . Benign essential HTN   . Chronic kidney disease, stage III (moderate)    chronic kidney disease stage 3  . Depression   . GERD (gastroesophageal reflux disease)   . History of blood transfusion    post hysterectomy  . Hypertensive heart disease without heart failure   . Memory deficit   . Neuromuscular disorder (HCC)    intermittent facial weakness - but at times its severe enough that "I have gone to the hospital", out of state but here in [place]- seen by Dr. Loretta Plume  . Numbness of face   . Other and unspecified hyperlipidemia   . Plantar fasciitis   . Stroke [facility])    TIA  . Unspecified transient cerebral ischemia    takes plavix for 2ndary stroke prevention per Dr. Tomi Likens with neurology    Past Surgical History:  Procedure Laterality Date  . BILATERAL SALPINGOOPHORECTOMY    . BREAST LUMPECTOMY WITH RADIOACTIVE SEED LOCALIZATION Right 08/02/2015   Procedure: BREAST LUMPECTOMY WITH RADIOACTIVE SEED LOCALIZATION;  Surgeon: [name], MD;  Location: [place];  Service: General;  Laterality: Right;  . colonoscopy     removed polyps- to f/u in 6 months  . TOTAL ABDOMINAL HYSTERECTOMY    . TRIGGER FINGER RELEASE Right 10/21/2016   Procedure: RELEASE TRIGGER  FINGER/A-1 PULLEY RIGHT RING FINGER;  Surgeon: Daryll Brod, MD;  Location: [place];  Service: Orthopedics;  Laterality: Right;    There were no vitals filed for this visit.  Subjective Assessment - 11/04/18 1137    Subjective  Pt states the discomfort moved to lower left abdomen since last visit.    Patient Stated Goals  get rid of pain and stop having leakage    Currently in Pain?  Yes    Pain Score  4     Pain Location  Abdomen                       [facility] Adult PT Treatment/Exercise - 11/04/18 0001      Self-Care   Other Self-Care Comments   types of fiber      Neuro Re-ed    Neuro Re-ed Details   biofeedback cues to relax and isolate pelvic floor - cues for not holding breath when engaging pelvic floor      Lumbar Exercises: Stretches   Double Knee to Chest Stretch  3 reps;10 seconds    Lower Trunk Rotation Limitations  LE windshield wipers    Figure 4 Stretch  1 rep;30 seconds      Lumbar Exercises: Seated   Other Seated Lumbar Exercises  cues to  breathe and relax, then educated and performed how to perform kegel in sitting, cues for rest breaks             PT Education - 11/04/18 1121    Education Details  types of fiber, Access Code: [email or web address]    Person(s) Educated  Patient    Methods  Explanation;Demonstration;Handout;Verbal cues    Comprehension  Verbalized understanding;Returned demonstration       PT Short Term Goals - 10/22/18 0933      PT SHORT TERM GOAL #1   Title  Pt will participate in establishment of initial HEP for improvement with strength deficits    Time  4    Period  Weeks    Status  New    Target Date  11/19/18        PT Long Term Goals - 10/22/18 0934      PT LONG TERM GOAL #1   Title  ind with advanced HEP    Time  8    Period  Weeks    Status  New    Target Date  12/17/18      PT LONG TERM GOAL #2   Title  Pt will report feeling at least 50% less wetness throughout the day    Time  8     Period  Weeks    Status  New    Target Date  12/17/18      PT LONG TERM GOAL #3   Title  Pt will report 50% less vaginal and abdominal pain    Time  8    Period  Weeks    Status  New    Target Date  12/17/18            Plan - 11/04/18 1143    Clinical Impression Statement  Pt needed a lot of cues to relax pelvic floor intiially.  After she was able to relax, she did well with kegels in sitting, but only able to hold for 1 sec and  get up to about 68mV.  Pt was educated in adding to HEP.  She was also educated in types of fiber since she reports eating mostly soluble fiber.  Pt was given handout with types of insoluble fiber that she can add to her diet.    PT Treatment/Interventions  ADLs/Self Care Home Management;Biofeedback;Cryotherapy;Electrical Stimulation;Moist Heat;Therapeutic exercise;Therapeutic activities;Neuromuscular re-education;Patient/family education;Manual techniques;Taping;Dry needling;Passive range of motion    PT Next Visit Plan  biofeedback, work on porgressing exercises in sitting    PT Home Exercise Plan  Access Code: [email or web address]    Consulted and Agree with Plan of Care  Patient       Patient will benefit from skilled therapeutic intervention in order to improve the following deficits and impairments:  Pain, Postural dysfunction, Impaired flexibility, Decreased coordination, Decreased strength  Visit Diagnosis: No diagnosis found.     Problem List Patient Active Problem List   Diagnosis Date Noted  . Hyperlipidemia 03/31/2016  . Hypertensive heart disease 03/31/2016  . Personal history of transient ischemic attack (TIA), and cerebral infarction without residual deficits 03/31/2016    Jule Ser, PT 11/04/2018, 12:55 PM  [facility] [address] [place], [place], [address] Phone: [phone]   Fax:  [phone]  Name: Christina Austin MRN: [id number] Date of Birth: 10-Jul-1934

## 2018-11-05 DIAGNOSIS — M7661 Achilles tendinitis, right leg: Secondary | ICD-10-CM | POA: Diagnosis not present

## 2018-11-05 DIAGNOSIS — M76822 Posterior tibial tendinitis, left leg: Secondary | ICD-10-CM | POA: Diagnosis not present

## 2018-11-08 DIAGNOSIS — M76822 Posterior tibial tendinitis, left leg: Secondary | ICD-10-CM | POA: Diagnosis not present

## 2018-11-08 DIAGNOSIS — M7661 Achilles tendinitis, right leg: Secondary | ICD-10-CM | POA: Diagnosis not present

## 2018-11-09 ENCOUNTER — Other Ambulatory Visit: Payer: Self-pay

## 2018-11-09 ENCOUNTER — Encounter: Payer: Self-pay | Admitting: Physical Therapy

## 2018-11-09 ENCOUNTER — Ambulatory Visit: Payer: Medicare Other | Admitting: Physical Therapy

## 2018-11-09 DIAGNOSIS — R279 Unspecified lack of coordination: Secondary | ICD-10-CM | POA: Diagnosis not present

## 2018-11-09 DIAGNOSIS — R252 Cramp and spasm: Secondary | ICD-10-CM | POA: Diagnosis not present

## 2018-11-09 DIAGNOSIS — M6281 Muscle weakness (generalized): Secondary | ICD-10-CM

## 2018-11-09 NOTE — Therapy (Signed)
[facility] [address], [place], [place], [address] Phone: [phone]   Fax:  [phone]  Physical Therapy Treatment  Patient Details  Name: ALAINAH LIGHTLE MRN: [id number] Date of Birth: 04-Jul-1934 Referring Provider (PT): Jasper Loser, MD   Encounter Date: 11/09/2018  PT End of Session - 11/09/18 1144    Visit Number  4    Date for PT Re-Evaluation  12/17/18    PT Start Time  [id number]    PT Stop Time  1228    PT Time Calculation (min)  44 min    Activity Tolerance  Patient tolerated treatment well    Behavior During Therapy  [facility] for tasks assessed/performed       Past Medical History:  Diagnosis Date  . Anxiety   . Arthritis    legs, shoulders   . Benign essential HTN   . Chronic kidney disease, stage III (moderate)    chronic kidney disease stage 3  . Depression   . GERD (gastroesophageal reflux disease)   . History of blood transfusion    post hysterectomy  . Hypertensive heart disease without heart failure   . Memory deficit   . Neuromuscular disorder (HCC)    intermittent facial weakness - but at times its severe enough that "I have gone to the hospital", out of state but here in [place]- seen by Dr. Loretta Plume  . Numbness of face   . Other and unspecified hyperlipidemia   . Plantar fasciitis   . Stroke [facility])    TIA  . Unspecified transient cerebral ischemia    takes plavix for 2ndary stroke prevention per Dr. Tomi Likens with neurology    Past Surgical History:  Procedure Laterality Date  . BILATERAL SALPINGOOPHORECTOMY    . BREAST LUMPECTOMY WITH RADIOACTIVE SEED LOCALIZATION Right 08/02/2015   Procedure: BREAST LUMPECTOMY WITH RADIOACTIVE SEED LOCALIZATION;  Surgeon: [name], MD;  Location: [place];  Service: General;  Laterality: Right;  . colonoscopy     removed polyps- to f/u in 6 months  . TOTAL ABDOMINAL HYSTERECTOMY    . TRIGGER FINGER RELEASE Right 10/21/2016   Procedure: RELEASE TRIGGER  FINGER/A-1 PULLEY RIGHT RING FINGER;  Surgeon: Daryll Brod, MD;  Location: [place];  Service: Orthopedics;  Laterality: Right;    There were no vitals filed for this visit.  Subjective Assessment - 11/09/18 1148    Subjective  Pt states the discomfort is the same when she needs to use the restroom.  There is pressure on the rectum and vaginal area since last night.    Patient Stated Goals  get rid of pain and stop having leakage    Currently in Pain?  Yes    Pain Score  4     Pain Location  Abdomen    Pain Orientation  --   moving   Pain Descriptors / Indicators  Discomfort    Pain Type  Chronic pain    Pain Onset  More than a month ago    Pain Frequency  Intermittent    Multiple Pain Sites  No                       [facility] Adult PT Treatment/Exercise - 11/09/18 0001      Self-Care   Self-Care  Posture    Posture  educated and performed sitting posture with towel roll      Neuro Re-ed    Neuro Re-ed Details  breathing using diaphragm in sitting, kegel with LE movements in sitting      Lumbar Exercises: Seated   Other Seated Lumbar Exercises  kegel in sitting with and without ball squeeze               PT Short Term Goals - 11/09/18 1218      PT SHORT TERM GOAL #1   Title  Pt will participate in establishment of initial HEP for improvement with strength deficits    Status  Achieved        PT Long Term Goals - 11/09/18 1353      PT LONG TERM GOAL #1   Title  ind with advanced HEP    Status  On-going      PT LONG TERM GOAL #2   Title  Pt will report feeling at least 50% less wetness throughout the day    Status  On-going      PT LONG TERM GOAL #3   Title  Pt will report 50% less vaginal and abdominal pain    Status  On-going            Plan - 11/09/18 1222    Clinical Impression Statement  Pt needs extra time and explanation for improved posture.  Pt felt better and had less pressure in tailbone when sitting with  towel roll.  Pt continues to have difficulty with pelvic floor contraction since it is hard for her to focus on 2 things at the same time.  She will benefit from skilled PT to continue to work on strengthening with repetition due to slower learning curve.    PT Treatment/Interventions  ADLs/Self Care Home Management;Biofeedback;Cryotherapy;Electrical Stimulation;Moist Heat;Therapeutic exercise;Therapeutic activities;Neuromuscular re-education;Patient/family education;Manual techniques;Taping;Dry needling;Passive range of motion    PT Next Visit Plan  biofeedback as needed, work on porgressing exercises in sitting    PT Home Exercise Plan  Access Code: [email or web address]    Consulted and Agree with Plan of Care  Patient       Patient will benefit from skilled therapeutic intervention in order to improve the following deficits and impairments:  Pain, Postural dysfunction, Impaired flexibility, Decreased coordination, Decreased strength  Visit Diagnosis: Muscle weakness (generalized)  Cramp and spasm  Unspecified lack of coordination     Problem List Patient Active Problem List   Diagnosis Date Noted  . Hyperlipidemia 03/31/2016  . Hypertensive heart disease 03/31/2016  . Personal history of transient ischemic attack (TIA), and cerebral infarction without residual deficits 03/31/2016    Jule Ser, PT 11/09/2018, 2:01 PM  [place] [address], [place], [place], [address] Phone: [phone]   Fax:  [phone]  Name: MILI CLAYTOR MRN: [id number] Date of Birth: August 21, 1934

## 2018-11-10 DIAGNOSIS — I10 Essential (primary) hypertension: Secondary | ICD-10-CM | POA: Diagnosis not present

## 2018-11-10 DIAGNOSIS — N329 Bladder disorder, unspecified: Secondary | ICD-10-CM | POA: Diagnosis not present

## 2018-11-10 DIAGNOSIS — M130 Polyarthritis, unspecified: Secondary | ICD-10-CM | POA: Diagnosis not present

## 2018-11-11 ENCOUNTER — Ambulatory Visit: Payer: Medicare Other | Admitting: Physical Therapy

## 2018-11-11 ENCOUNTER — Other Ambulatory Visit: Payer: Self-pay

## 2018-11-11 ENCOUNTER — Encounter: Payer: Self-pay | Admitting: Physical Therapy

## 2018-11-11 DIAGNOSIS — M6281 Muscle weakness (generalized): Secondary | ICD-10-CM | POA: Diagnosis not present

## 2018-11-11 DIAGNOSIS — R279 Unspecified lack of coordination: Secondary | ICD-10-CM | POA: Diagnosis not present

## 2018-11-11 DIAGNOSIS — R252 Cramp and spasm: Secondary | ICD-10-CM | POA: Diagnosis not present

## 2018-11-11 NOTE — Therapy (Signed)
[facility] [address], [place], [place], [address] Phone: [phone]   Fax:  [phone]  Physical Therapy Treatment  Patient Details  Name: Christina Austin MRN: [id number] Date of Birth: 1934/08/16 Referring Provider (PT): Jasper Loser, MD   Encounter Date: 11/11/2018  PT End of Session - 11/11/18 1227    Visit Number  5    Date for PT Re-Evaluation  12/17/18    PT Start Time  1147    PT Stop Time  1228    PT Time Calculation (min)  41 min    Activity Tolerance  Patient tolerated treatment well    Behavior During Therapy  [facility] for tasks assessed/performed       Past Medical History:  Diagnosis Date  . Anxiety   . Arthritis    legs, shoulders   . Benign essential HTN   . Chronic kidney disease, stage III (moderate)    chronic kidney disease stage 3  . Depression   . GERD (gastroesophageal reflux disease)   . History of blood transfusion    post hysterectomy  . Hypertensive heart disease without heart failure   . Memory deficit   . Neuromuscular disorder (HCC)    intermittent facial weakness - but at times its severe enough that "I have gone to the hospital", out of state but here in [place]- seen by Dr. Loretta Plume  . Numbness of face   . Other and unspecified hyperlipidemia   . Plantar fasciitis   . Stroke [facility])    TIA  . Unspecified transient cerebral ischemia    takes plavix for 2ndary stroke prevention per Dr. Tomi Likens with neurology    Past Surgical History:  Procedure Laterality Date  . BILATERAL SALPINGOOPHORECTOMY    . BREAST LUMPECTOMY WITH RADIOACTIVE SEED LOCALIZATION Right 08/02/2015   Procedure: BREAST LUMPECTOMY WITH RADIOACTIVE SEED LOCALIZATION;  Surgeon: [name], MD;  Location: [place];  Service: General;  Laterality: Right;  . colonoscopy     removed polyps- to f/u in 6 months  . TOTAL ABDOMINAL HYSTERECTOMY    . TRIGGER FINGER RELEASE Right 10/21/2016   Procedure: RELEASE TRIGGER  FINGER/A-1 PULLEY RIGHT RING FINGER;  Surgeon: Daryll Brod, MD;  Location: [place];  Service: Orthopedics;  Laterality: Right;    There were no vitals filed for this visit.  Subjective Assessment - 11/11/18 1154    Subjective  I almost always have some hip pain, but it is a little more than it usually is.  It was up to 5/10 last night and this morning though and woke up up at night.  I started back walking and has walked 3 blocks for a few days in a row.    Limitations  Walking;Sitting    Patient Stated Goals  get rid of pain and stop having leakage    Currently in Pain?  Yes    Pain Score  3     Pain Location  Hip    Pain Orientation  Right    Pain Descriptors / Indicators  Discomfort    Pain Type  Chronic pain    Pain Radiating Towards  back and around to the abdomen    Multiple Pain Sites  No                       [facility] Adult PT Treatment/Exercise - 11/11/18 0001      Lumbar Exercises: Aerobic   Nustep  L2 x 7  min warm up core and pelvic floor       Manual Therapy   Manual Therapy  Soft tissue mobilization;Myofascial release;Passive ROM    Soft tissue mobilization  Rt gluteals, adductors, externally to ischio cavernosis and transverse preoneus    Myofascial Release  Rt lower abdomen    Passive ROM  Rt hip adduction, abduction, FABER and FADIR - increased pain all directions               PT Short Term Goals - 11/09/18 1218      PT SHORT TERM GOAL #1   Title  Pt will participate in establishment of initial HEP for improvement with strength deficits    Status  Achieved        PT Long Term Goals - 11/09/18 1353      PT LONG TERM GOAL #1   Title  ind with advanced HEP    Status  On-going      PT LONG TERM GOAL #2   Title  Pt will report feeling at least 50% less wetness throughout the day    Status  On-going      PT LONG TERM GOAL #3   Title  Pt will report 50% less vaginal and abdominal pain    Status  On-going             Plan - 11/11/18 1248    Clinical Impression Statement  Pt was complaining of hip pain at the beginning of session today.  Pt has pain with FABER and FADIR on Rt side.  She has trigger points palpated throughout Rt gluteals and hip adductors.  Pt has tenderness along Rt ischiocavernosis and transverse peroneous.  Today's session focused on manual techniques for pain management and work on breathing and relaxing muscle tension. Pt needed some review of using towel roll for improved sitting posture.    PT Treatment/Interventions  ADLs/Self Care Home Management;Biofeedback;Cryotherapy;Electrical Stimulation;Moist Heat;Therapeutic exercise;Therapeutic activities;Neuromuscular re-education;Patient/family education;Manual techniques;Taping;Dry needling;Passive range of motion    PT Next Visit Plan  f/u on towel roll, internal STM and feedback for kegels, biofeedback as needed, work on porgressing exercises in sitting    PT Home Exercise Plan  Access Code: [email or web address]    Consulted and Agree with Plan of Care  Patient       Patient will benefit from skilled therapeutic intervention in order to improve the following deficits and impairments:  Pain, Postural dysfunction, Impaired flexibility, Decreased coordination, Decreased strength  Visit Diagnosis: Muscle weakness (generalized)  Cramp and spasm  Unspecified lack of coordination     Problem List Patient Active Problem List   Diagnosis Date Noted  . Hyperlipidemia 03/31/2016  . Hypertensive heart disease 03/31/2016  . Personal history of transient ischemic attack (TIA), and cerebral infarction without residual deficits 03/31/2016    Jule Ser, PT 11/11/2018, 1:23 PM  [facility] [address] [place], [place], [address] Phone: [phone]   Fax:  [phone]  Name: Christina Austin MRN: [id number] Date of Birth: Jun 19, 1934

## 2018-11-12 DIAGNOSIS — M76822 Posterior tibial tendinitis, left leg: Secondary | ICD-10-CM | POA: Diagnosis not present

## 2018-11-12 DIAGNOSIS — M7661 Achilles tendinitis, right leg: Secondary | ICD-10-CM | POA: Diagnosis not present

## 2018-11-15 DIAGNOSIS — M76822 Posterior tibial tendinitis, left leg: Secondary | ICD-10-CM | POA: Diagnosis not present

## 2018-11-15 DIAGNOSIS — M7661 Achilles tendinitis, right leg: Secondary | ICD-10-CM | POA: Diagnosis not present

## 2018-11-16 ENCOUNTER — Ambulatory Visit: Payer: Medicare Other | Attending: Obstetrics and Gynecology | Admitting: Physical Therapy

## 2018-11-16 ENCOUNTER — Other Ambulatory Visit: Payer: Self-pay

## 2018-11-16 DIAGNOSIS — R279 Unspecified lack of coordination: Secondary | ICD-10-CM | POA: Insufficient documentation

## 2018-11-16 DIAGNOSIS — R2689 Other abnormalities of gait and mobility: Secondary | ICD-10-CM | POA: Diagnosis not present

## 2018-11-16 DIAGNOSIS — M6281 Muscle weakness (generalized): Secondary | ICD-10-CM

## 2018-11-16 DIAGNOSIS — R252 Cramp and spasm: Secondary | ICD-10-CM

## 2018-11-16 NOTE — Patient Instructions (Signed)

## 2018-11-16 NOTE — Therapy (Signed)
[facility] [address] [place], [place], [address] Phone: [phone]   Fax:  [phone]  Physical Therapy Treatment  Patient Details  Name: Christina Austin MRN: [id number] Date of Birth: 01-11-1935 Referring Provider (PT): Jasper Loser, MD   Encounter Date: 11/16/2018  PT End of Session - 11/16/18 1321    Visit Number  6    Date for PT Re-Evaluation  12/17/18    PT Start Time  1143    PT Stop Time  1228    PT Time Calculation (min)  45 min    Activity Tolerance  Patient tolerated treatment well    Behavior During Therapy  [facility] for tasks assessed/performed       Past Medical History:  Diagnosis Date  . Anxiety   . Arthritis    legs, shoulders   . Benign essential HTN   . Chronic kidney disease, stage III (moderate)    chronic kidney disease stage 3  . Depression   . GERD (gastroesophageal reflux disease)   . History of blood transfusion    post hysterectomy  . Hypertensive heart disease without heart failure   . Memory deficit   . Neuromuscular disorder (HCC)    intermittent facial weakness - but at times its severe enough that "I have gone to the hospital", out of state but here in [place]- seen by Dr. Loretta Plume  . Numbness of face   . Other and unspecified hyperlipidemia   . Plantar fasciitis   . Stroke [facility])    TIA  . Unspecified transient cerebral ischemia    takes plavix for 2ndary stroke prevention per Dr. Tomi Likens with neurology    Past Surgical History:  Procedure Laterality Date  . BILATERAL SALPINGOOPHORECTOMY    . BREAST LUMPECTOMY WITH RADIOACTIVE SEED LOCALIZATION Right 08/02/2015   Procedure: BREAST LUMPECTOMY WITH RADIOACTIVE SEED LOCALIZATION;  Surgeon: [name], MD;  Location: [place];  Service: General;  Laterality: Right;  . colonoscopy     removed polyps- to f/u in 6 months  . TOTAL ABDOMINAL HYSTERECTOMY    . TRIGGER FINGER RELEASE Right 10/21/2016   Procedure: RELEASE TRIGGER  FINGER/A-1 PULLEY RIGHT RING FINGER;  Surgeon: Daryll Brod, MD;  Location: [place];  Service: Orthopedics;  Laterality: Right;    There were no vitals filed for this visit.  Subjective Assessment - 11/16/18 1320    Subjective  I am still having hip pain.  I can walk more now.  The abdomen hasn't changed.  I am changing panty liners 2x/day now.    Limitations  Walking;Sitting    Currently in Pain?  Yes    Pain Score  3     Pain Location  Hip    Multiple Pain Sites  No                    Pelvic Floor Special Questions - 11/16/18 0001    Urinary Leakage  Yes    How often  wetness during the day    Pad use  2x/ day        [facility] Adult PT Treatment/Exercise - 11/16/18 0001      Lumbar Exercises: Stretches   Active Hamstring Stretch  Right;Left;2 reps;20 seconds    Hip Flexor Stretch  Right;Left;2 reps;20 seconds    Figure 4 Stretch  1 rep;30 seconds      Manual Therapy   Soft tissue mobilization  Rt gluteals, TFL  PT Education - 11/16/18 1322    Education Details  dry needling info and update to Access Code: [email or web address] added stretches    Person(s) Educated  Patient    Methods  Explanation;Demonstration;Handout;Verbal cues    Comprehension  Verbalized understanding;Returned demonstration;Tactile cues required       PT Short Term Goals - 11/09/18 1218      PT SHORT TERM GOAL #1   Title  Pt will participate in establishment of initial HEP for improvement with strength deficits    Status  Achieved        PT Long Term Goals - 11/16/18 1216      PT LONG TERM GOAL #1   Title  ind with advanced HEP    Status  On-going      PT LONG TERM GOAL #2   Title  Pt will report feeling at least 50% less wetness throughout the day    Baseline  2 panty liners down from 5-7    Status  On-going      PT LONG TERM GOAL #3   Title  Pt will report 50% less vaginal and abdominal pain    Baseline  it feels constant, but it feels like a  pressure in the vagina and rectum            Plan - 11/16/18 1415    Clinical Impression Statement  Pt continues to have tension throughout the Rt hip.  History of issue is difficult to gather due to patient having a hard time recalling details.  Pt does state she is having to use less panty liners and has been performing exercises.  Muscles tension was improved with soft tissue today.  She was given streches in order to maintain improved soft tissue length.  Pt will benefit from skilled PT to continue working on current POC.    PT Treatment/Interventions  ADLs/Self Care Home Management;Biofeedback;Cryotherapy;Electrical Stimulation;Moist Heat;Therapeutic exercise;Therapeutic activities;Neuromuscular re-education;Patient/family education;Manual techniques;Taping;Dry needling;Passive range of motion    PT Next Visit Plan  f/u on addition of stretches to HEP, dry needle to glutes if needed, internal STM and biofeedback for pelvic floor strength, progress strength    PT Home Exercise Plan  Access Code: [email or web address]    Consulted and Agree with Plan of Care  Patient       Patient will benefit from skilled therapeutic intervention in order to improve the following deficits and impairments:  Pain, Postural dysfunction, Impaired flexibility, Decreased coordination, Decreased strength  Visit Diagnosis: Muscle weakness (generalized)  Cramp and spasm  Unspecified lack of coordination  Other abnormalities of gait and mobility     Problem List Patient Active Problem List   Diagnosis Date Noted  . Hyperlipidemia 03/31/2016  . Hypertensive heart disease 03/31/2016  . Personal history of transient ischemic attack (TIA), and cerebral infarction without residual deficits 03/31/2016    Jule Ser, PT 11/16/2018, 2:22 PM  [facility] [address] [place], [place], [address] Phone: [phone]   Fax:  [phone]  Name:  Christina Austin MRN: [id number] Date of Birth: November 21, 1934

## 2018-11-17 DIAGNOSIS — M76822 Posterior tibial tendinitis, left leg: Secondary | ICD-10-CM | POA: Diagnosis not present

## 2018-11-17 DIAGNOSIS — M7661 Achilles tendinitis, right leg: Secondary | ICD-10-CM | POA: Diagnosis not present

## 2018-11-18 ENCOUNTER — Other Ambulatory Visit: Payer: Self-pay

## 2018-11-18 ENCOUNTER — Ambulatory Visit: Payer: Medicare Other | Admitting: Physical Therapy

## 2018-11-18 DIAGNOSIS — M6281 Muscle weakness (generalized): Secondary | ICD-10-CM

## 2018-11-18 DIAGNOSIS — R252 Cramp and spasm: Secondary | ICD-10-CM

## 2018-11-18 DIAGNOSIS — R2689 Other abnormalities of gait and mobility: Secondary | ICD-10-CM

## 2018-11-18 DIAGNOSIS — R279 Unspecified lack of coordination: Secondary | ICD-10-CM | POA: Diagnosis not present

## 2018-11-18 NOTE — Therapy (Signed)
[facility] [address], [place], [place], [address] Phone: [phone]   Fax:  [phone]  Physical Therapy Treatment  Patient Details  Name: Christina Austin MRN: [id number] Date of Birth: 1934/01/26 Referring Provider (PT): Jasper Loser, MD   Encounter Date: 11/18/2018  PT End of Session - 11/18/18 1151    Visit Number  7    Date for PT Re-Evaluation  12/17/18    PT Start Time  1145    PT Stop Time  1225    PT Time Calculation (min)  40 min    Activity Tolerance  Patient tolerated treatment well    Behavior During Therapy  [facility] for tasks assessed/performed       Past Medical History:  Diagnosis Date  . Anxiety   . Arthritis    legs, shoulders   . Benign essential HTN   . Chronic kidney disease, stage III (moderate)    chronic kidney disease stage 3  . Depression   . GERD (gastroesophageal reflux disease)   . History of blood transfusion    post hysterectomy  . Hypertensive heart disease without heart failure   . Memory deficit   . Neuromuscular disorder (HCC)    intermittent facial weakness - but at times its severe enough that "I have gone to the hospital", out of state but here in [place]- seen by Dr. Loretta Plume  . Numbness of face   . Other and unspecified hyperlipidemia   . Plantar fasciitis   . Stroke [facility])    TIA  . Unspecified transient cerebral ischemia    takes plavix for 2ndary stroke prevention per Dr. Tomi Likens with neurology    Past Surgical History:  Procedure Laterality Date  . BILATERAL SALPINGOOPHORECTOMY    . BREAST LUMPECTOMY WITH RADIOACTIVE SEED LOCALIZATION Right 08/02/2015   Procedure: BREAST LUMPECTOMY WITH RADIOACTIVE SEED LOCALIZATION;  Surgeon: [name], MD;  Location: [place];  Service: General;  Laterality: Right;  . colonoscopy     removed polyps- to f/u in 6 months  . TOTAL ABDOMINAL HYSTERECTOMY    . TRIGGER FINGER RELEASE Right 10/21/2016   Procedure: RELEASE TRIGGER  FINGER/A-1 PULLEY RIGHT RING FINGER;  Surgeon: Daryll Brod, MD;  Location: [place];  Service: Orthopedics;  Laterality: Right;    There were no vitals filed for this visit.  Subjective Assessment - 11/18/18 1152    Subjective  Pt states she noticed her pad was dry all day yesterday.    Limitations  Walking;Sitting    Patient Stated Goals  get rid of pain and stop having leakage    Currently in Pain?  Yes    Pain Score  3     Pain Location  Abdomen    Pain Orientation  Right    Pain Descriptors / Indicators  Discomfort    Pain Type  Chronic pain    Pain Onset  More than a month ago    Pain Frequency  Intermittent    Multiple Pain Sites  No                       [facility] Adult PT Treatment/Exercise - 11/18/18 0001      Lumbar Exercises: Stretches   Active Hamstring Stretch  Right;Left;2 reps;20 seconds    Hip Flexor Stretch  Right;Left;2 reps;20 seconds    Figure 4 Stretch  1 rep;30 seconds      Lumbar Exercises: Aerobic   UBE (Upper Arm Bike)  L1 x 12 min with cues for posture and engage core - 6 x 6 fwd/back   PT present for status update and cueing     Lumbar Exercises: Supine   Ab Set  --   cues to keep hips level   Bridge  10 reps;3 seconds      Manual Therapy   Manual Therapy  Joint mobilization    Joint Mobilization  Lt hip long axix distraction               PT Short Term Goals - 11/09/18 1218      PT SHORT TERM GOAL #1   Title  Pt will participate in establishment of initial HEP for improvement with strength deficits    Status  Achieved        PT Long Term Goals - 11/18/18 1206      PT LONG TERM GOAL #1   Title  ind with advanced HEP    Status  On-going      PT LONG TERM GOAL #2   Title  Pt will report feeling at least 50% less wetness throughout the day    Baseline  no wetness for a couple of days - will re-assess this goal for consistency    Status  Partially Met      PT LONG TERM GOAL #3   Title  Pt will  report 50% less vaginal and abdominal pain    Baseline  it feels constant in the abdomen, but it feels like a pressure in the vagina and rectum, 6/10 pressure yesterday    Status  On-going            Plan - 11/18/18 1233    Clinical Impression Statement  Pt had no hip pain today.  She has almost met goal for reduced leakage but is not consistent yet and has a hard time with short term memory recall.  Pt did well with exercises needing a lot of cueing and reminders of how to perform exercises throughout.  She will benefit from skilled PT to ensure she has met goals and that she can transistion successfully to HEP.    PT Treatment/Interventions  ADLs/Self Care Home Management;Biofeedback;Cryotherapy;Electrical Stimulation;Moist Heat;Therapeutic exercise;Therapeutic activities;Neuromuscular re-education;Patient/family education;Manual techniques;Taping;Dry needling;Passive range of motion    PT Next Visit Plan  gluteal and internal STM if needed, progress core and pelvic floor as tolerated    PT Home Exercise Plan  Access Code: [email or web address]    Consulted and Agree with Plan of Care  Patient       Patient will benefit from skilled therapeutic intervention in order to improve the following deficits and impairments:  Pain, Postural dysfunction, Impaired flexibility, Decreased coordination, Decreased strength  Visit Diagnosis: Muscle weakness (generalized)  Cramp and spasm  Unspecified lack of coordination  Other abnormalities of gait and mobility     Problem List Patient Active Problem List   Diagnosis Date Noted  . Hyperlipidemia 03/31/2016  . Hypertensive heart disease 03/31/2016  . Personal history of transient ischemic attack (TIA), and cerebral infarction without residual deficits 03/31/2016    Jule Ser, PT 11/18/2018, 12:39 PM  [facility] [address], [place], [place], [address] Phone: [phone]    Fax:  [phone]  Name: Christina Austin MRN: [id number] Date of Birth: 09/10/1934

## 2018-11-23 ENCOUNTER — Other Ambulatory Visit: Payer: Self-pay

## 2018-11-23 ENCOUNTER — Ambulatory Visit: Payer: Medicare Other | Admitting: Physical Therapy

## 2018-11-23 DIAGNOSIS — M6281 Muscle weakness (generalized): Secondary | ICD-10-CM

## 2018-11-23 DIAGNOSIS — R2689 Other abnormalities of gait and mobility: Secondary | ICD-10-CM | POA: Diagnosis not present

## 2018-11-23 DIAGNOSIS — R279 Unspecified lack of coordination: Secondary | ICD-10-CM | POA: Diagnosis not present

## 2018-11-23 DIAGNOSIS — R252 Cramp and spasm: Secondary | ICD-10-CM

## 2018-11-23 NOTE — Therapy (Signed)
[facility] [address] [place], [place], [address] Phone: [phone]   Fax:  [phone]  Physical Therapy Treatment  Patient Details  Name: Christina Austin MRN: [id number] Date of Birth: 07-24-1934 Referring Provider (PT): Jasper Loser, MD   Encounter Date: 11/23/2018  PT End of Session - 11/23/18 1136    Visit Number  8    Date for PT Re-Evaluation  12/17/18    PT Start Time  1128    PT Stop Time  1210    PT Time Calculation (min)  42 min    Activity Tolerance  Patient tolerated treatment well    Behavior During Therapy  [facility] for tasks assessed/performed       Past Medical History:  Diagnosis Date  . Anxiety   . Arthritis    legs, shoulders   . Benign essential HTN   . Chronic kidney disease, stage III (moderate)    chronic kidney disease stage 3  . Depression   . GERD (gastroesophageal reflux disease)   . History of blood transfusion    post hysterectomy  . Hypertensive heart disease without heart failure   . Memory deficit   . Neuromuscular disorder (HCC)    intermittent facial weakness - but at times its severe enough that "I have gone to the hospital", out of state but here in [place]- seen by Dr. Loretta Plume  . Numbness of face   . Other and unspecified hyperlipidemia   . Plantar fasciitis   . Stroke [facility])    TIA  . Unspecified transient cerebral ischemia    takes plavix for 2ndary stroke prevention per Dr. Tomi Likens with neurology    Past Surgical History:  Procedure Laterality Date  . BILATERAL SALPINGOOPHORECTOMY    . BREAST LUMPECTOMY WITH RADIOACTIVE SEED LOCALIZATION Right 08/02/2015   Procedure: BREAST LUMPECTOMY WITH RADIOACTIVE SEED LOCALIZATION;  Surgeon: [name], MD;  Location: [place];  Service: General;  Laterality: Right;  . colonoscopy     removed polyps- to f/u in 6 months  . TOTAL ABDOMINAL HYSTERECTOMY    . TRIGGER FINGER RELEASE Right 10/21/2016   Procedure: RELEASE TRIGGER  FINGER/A-1 PULLEY RIGHT RING FINGER;  Surgeon: Daryll Brod, MD;  Location: [place];  Service: Orthopedics;  Laterality: Right;    There were no vitals filed for this visit.  Subjective Assessment - 11/23/18 1210    Subjective  I had a normal BM yesterday.  I also had a lot of pain yesterday in my abdomen last night and I am not sure why. I had feces on the towel when I wiped and I didn't think I had BM yesterday also.  I am still having weakness and pain in my legs and it is hard to walk as far as I want.    Currently in Pain?  Yes    Pain Score  3     Pain Location  Abdomen    Pain Orientation  Right;Left    Pain Descriptors / Indicators  Discomfort    Pain Onset  More than a month ago    Multiple Pain Sites  No                       [facility] Adult PT Treatment/Exercise - 11/23/18 0001      Lumbar Exercises: Aerobic   Nustep  L3 x 6 min warm up core and pelvic floor    cues to engage core  Manual Therapy   Manual Therapy  Myofascial release    Myofascial Release  thoracic and pelvic diaphragm with one hand anterior and one posterior in supine positon - moist heat at lumbar; fascial release and traction to sacrum with one hand at sacrum tractioning inferiorly               PT Short Term Goals - 11/09/18 1218      PT SHORT TERM GOAL #1   Title  Pt will participate in establishment of initial HEP for improvement with strength deficits    Status  Achieved        PT Long Term Goals - 11/18/18 1206      PT LONG TERM GOAL #1   Title  ind with advanced HEP    Status  On-going      PT LONG TERM GOAL #2   Title  Pt will report feeling at least 50% less wetness throughout the day    Baseline  no wetness for a couple of days - will re-assess this goal for consistency    Status  Partially Met      PT LONG TERM GOAL #3   Title  Pt will report 50% less vaginal and abdominal pain    Baseline  it feels constant in the abdomen, but it feels  like a pressure in the vagina and rectum, 6/10 pressure yesterday    Status  On-going            Plan - 11/23/18 1216    Clinical Impression Statement  Pt had some fascial release in thoracic diaphragm and definite release with improved sacral position during treatment today.  Sacrum was in greater extension and able to relax in to more neutral position.  Pt felt like she had some kind of leakage during fascial release, but nothing noticed by PT.  She wasn't sure but went to use the bathroom after treatment.  Pt is slowly progressing due to difficutly remembering and has trouble retaining all the information given at each session.  She will benefit form skilled PT and repetition of exercises in order to make more permanent changes.    PT Treatment/Interventions  ADLs/Self Care Home Management;Biofeedback;Cryotherapy;Electrical Stimulation;Moist Heat;Therapeutic exercise;Therapeutic activities;Neuromuscular re-education;Patient/family education;Manual techniques;Taping;Dry needling;Passive range of motion    PT Next Visit Plan  f/u on BM and leakage, gluteal and internal STM if needed, progress core and pelvic floor as tolerated    PT Home Exercise Plan  Access Code: [email or web address]    Recommended Other Services  cert is signed    Consulted and Agree with Plan of Care  Patient       Patient will benefit from skilled therapeutic intervention in order to improve the following deficits and impairments:  Pain, Postural dysfunction, Impaired flexibility, Decreased coordination, Decreased strength  Visit Diagnosis: Muscle weakness (generalized)  Cramp and spasm  Unspecified lack of coordination     Problem List Patient Active Problem List   Diagnosis Date Noted  . Hyperlipidemia 03/31/2016  . Hypertensive heart disease 03/31/2016  . Personal history of transient ischemic attack (TIA), and cerebral infarction without residual deficits 03/31/2016    Jule Ser, PT 11/23/2018, 12:25  PM  [facility] [address], [place], [place], [address] Phone: [phone]   Fax:  [phone]  Name: Christina Austin MRN: [id number] Date of Birth: April 26, 1934

## 2018-11-24 DIAGNOSIS — M6289 Other specified disorders of muscle: Secondary | ICD-10-CM | POA: Diagnosis not present

## 2018-11-24 DIAGNOSIS — Z8719 Personal history of other diseases of the digestive system: Secondary | ICD-10-CM | POA: Diagnosis not present

## 2018-11-24 DIAGNOSIS — K5900 Constipation, unspecified: Secondary | ICD-10-CM | POA: Diagnosis not present

## 2018-11-24 DIAGNOSIS — R194 Change in bowel habit: Secondary | ICD-10-CM | POA: Diagnosis not present

## 2018-11-25 ENCOUNTER — Ambulatory Visit: Payer: Medicare Other | Admitting: Physical Therapy

## 2018-11-25 ENCOUNTER — Encounter: Payer: Self-pay | Admitting: Physical Therapy

## 2018-11-25 ENCOUNTER — Other Ambulatory Visit: Payer: Self-pay

## 2018-11-25 DIAGNOSIS — M6281 Muscle weakness (generalized): Secondary | ICD-10-CM | POA: Diagnosis not present

## 2018-11-25 DIAGNOSIS — R252 Cramp and spasm: Secondary | ICD-10-CM

## 2018-11-25 DIAGNOSIS — R279 Unspecified lack of coordination: Secondary | ICD-10-CM

## 2018-11-25 DIAGNOSIS — R2689 Other abnormalities of gait and mobility: Secondary | ICD-10-CM | POA: Diagnosis not present

## 2018-11-25 NOTE — Therapy (Signed)
[facility] [address] [place], [place], [address] Phone: [phone]   Fax:  [phone]  Physical Therapy Treatment  Patient Details  Name: Christina Austin MRN: [id number] Date of Birth: [birth year] Referring Provider (PT): Jasper Loser, MD   Encounter Date: 11/25/2018  PT End of Session - 11/25/18 1153    Visit Number  9    Date for PT Re-Evaluation  12/17/18    PT Start Time  1104    PT Stop Time  1150    PT Time Calculation (min)  46 min    Activity Tolerance  Patient tolerated treatment well    Behavior During Therapy  [facility] for tasks assessed/performed       Past Medical History:  Diagnosis Date  . Anxiety   . Arthritis    legs, shoulders   . Benign essential HTN   . Chronic kidney disease, stage III (moderate)    chronic kidney disease stage 3  . Depression   . GERD (gastroesophageal reflux disease)   . History of blood transfusion    post hysterectomy  . Hypertensive heart disease without heart failure   . Memory deficit   . Neuromuscular disorder (HCC)    intermittent facial weakness - but at times its severe enough that "I have gone to the hospital", out of state but here in [place]- seen by Dr. Loretta Plume  . Numbness of face   . Other and unspecified hyperlipidemia   . Plantar fasciitis   . Stroke [facility])    TIA  . Unspecified transient cerebral ischemia    takes plavix for 2ndary stroke prevention per Dr. Tomi Likens with neurology    Past Surgical History:  Procedure Laterality Date  . BILATERAL SALPINGOOPHORECTOMY    . BREAST LUMPECTOMY WITH RADIOACTIVE SEED LOCALIZATION Right 08/02/2015   Procedure: BREAST LUMPECTOMY WITH RADIOACTIVE SEED LOCALIZATION;  Surgeon: [name], MD;  Location: [place];  Service: General;  Laterality: Right;  . colonoscopy     removed polyps- to f/u in 6 months  . TOTAL ABDOMINAL HYSTERECTOMY    . TRIGGER FINGER RELEASE Right 10/21/2016   Procedure: RELEASE TRIGGER  FINGER/A-1 PULLEY RIGHT RING FINGER;  Surgeon: Daryll Brod, MD;  Location: [place];  Service: Orthopedics;  Laterality: Right;    There were no vitals filed for this visit.  Subjective Assessment - 11/25/18 1216    Subjective  I am not sure if the pelvic floor is causing the abdominal pain or not.  I saw the GI yesterday and I will have a colonoscopy December 2    Limitations  Walking;Sitting    Patient Stated Goals  get rid of pain and stop having leakage    Currently in Pain?  Yes    Pain Score  3     Pain Location  Abdomen    Multiple Pain Sites  No                       [facility] Adult PT Treatment/Exercise - 11/25/18 0001      Manual Therapy   Manual Therapy  Myofascial release    Myofascial Release  bladder, vaginal canal, and rectum release; ascending/descending colon release - more restricted and tender around ascending colon               PT Short Term Goals - 11/09/18 1218      PT SHORT TERM GOAL #1   Title  Pt will participate  in establishment of initial HEP for improvement with strength deficits    Status  Achieved        PT Long Term Goals - 11/18/18 1206      PT LONG TERM GOAL #1   Title  ind with advanced HEP    Status  On-going      PT LONG TERM GOAL #2   Title  Pt will report feeling at least 50% less wetness throughout the day    Baseline  no wetness for a couple of days - will re-assess this goal for consistency    Status  Partially Met      PT LONG TERM GOAL #3   Title  Pt will report 50% less vaginal and abdominal pain    Baseline  it feels constant in the abdomen, but it feels like a pressure in the vagina and rectum, 6/10 pressure yesterday    Status  On-going            Plan - 11/25/18 1203    Clinical Impression Statement  Pt responded well to fascial release techniques. She is still complaining of the same pain Rt lower quadrant so a different fascial release technique was used today.  Pt still has  less leakage overall.  Pt will benefit from skilled PT to re-assess pelvic floor and ensure successful transition to being ind with HEP    PT Treatment/Interventions  ADLs/Self Care Home Management;Biofeedback;Cryotherapy;Electrical Stimulation;Moist Heat;Therapeutic exercise;Therapeutic activities;Neuromuscular re-education;Patient/family education;Manual techniques;Taping;Dry needling;Passive range of motion    PT Next Visit Plan  re-assess goals, dishcarge likely, f/u on walking, abdominal pain, leaakge, HEP final maybe use biofeedback to re-assess    PT Home Exercise Plan  Access Code: [email or web address]    Consulted and Agree with Plan of Care  Patient       Patient will benefit from skilled therapeutic intervention in order to improve the following deficits and impairments:  Pain, Postural dysfunction, Impaired flexibility, Decreased coordination, Decreased strength  Visit Diagnosis: Muscle weakness (generalized)  Cramp and spasm  Unspecified lack of coordination     Problem List Patient Active Problem List   Diagnosis Date Noted  . Hyperlipidemia 03/31/2016  . Hypertensive heart disease 03/31/2016  . Personal history of transient ischemic attack (TIA), and cerebral infarction without residual deficits 03/31/2016    Jule Ser, PT 11/25/2018, 12:24 PM  [facility] [address] [place], [place], [address] Phone: [phone]   Fax:  [phone]  Name: JAYME MEDNICK MRN: [id number] Date of Birth: 1934-05-12

## 2018-11-29 ENCOUNTER — Other Ambulatory Visit: Payer: Self-pay

## 2018-11-29 ENCOUNTER — Encounter: Payer: Self-pay | Admitting: Physical Therapy

## 2018-11-29 ENCOUNTER — Ambulatory Visit: Payer: Medicare Other | Admitting: Physical Therapy

## 2018-11-29 DIAGNOSIS — R252 Cramp and spasm: Secondary | ICD-10-CM | POA: Diagnosis not present

## 2018-11-29 DIAGNOSIS — M6281 Muscle weakness (generalized): Secondary | ICD-10-CM | POA: Diagnosis not present

## 2018-11-29 DIAGNOSIS — R279 Unspecified lack of coordination: Secondary | ICD-10-CM | POA: Diagnosis not present

## 2018-11-29 DIAGNOSIS — R2689 Other abnormalities of gait and mobility: Secondary | ICD-10-CM | POA: Diagnosis not present

## 2018-11-29 NOTE — Therapy (Addendum)
[facility] [address] [place], [place], [address] Phone: [phone]   Fax:  [phone]  Physical Therapy Treatment Progress Note Reporting Period 10/22/18 to 11/29/18   See note below for Objective Data and Assessment of Progress/Goals.      Patient Details  Name: Christina Austin MRN: [id number] Date of Birth: 1934/11/14 Referring Provider (PT): Jasper Loser, MD   Encounter Date: 11/29/2018  PT End of Session - 11/29/18 0842    Visit Number  10    Date for PT Re-Evaluation  12/17/18    PT Start Time  0842    PT Stop Time  0925    PT Time Calculation (min)  43 min    Activity Tolerance  Patient tolerated treatment well    Behavior During Therapy  [facility] for tasks assessed/performed       Past Medical History:  Diagnosis Date  . Anxiety   . Arthritis    legs, shoulders   . Benign essential HTN   . Chronic kidney disease, stage III (moderate)    chronic kidney disease stage 3  . Depression   . GERD (gastroesophageal reflux disease)   . History of blood transfusion    post hysterectomy  . Hypertensive heart disease without heart failure   . Memory deficit   . Neuromuscular disorder (HCC)    intermittent facial weakness - but at times its severe enough that "I have gone to the hospital", out of state but here in [place]- seen by Dr. Loretta Plume  . Numbness of face   . Other and unspecified hyperlipidemia   . Plantar fasciitis   . Stroke [facility])    TIA  . Unspecified transient cerebral ischemia    takes plavix for 2ndary stroke prevention per Dr. Tomi Likens with neurology    Past Surgical History:  Procedure Laterality Date  . BILATERAL SALPINGOOPHORECTOMY    . BREAST LUMPECTOMY WITH RADIOACTIVE SEED LOCALIZATION Right 08/02/2015   Procedure: BREAST LUMPECTOMY WITH RADIOACTIVE SEED LOCALIZATION;  Surgeon: [name], MD;  Location: [place];  Service: General;  Laterality: Right;  . colonoscopy     removed  polyps- to f/u in 6 months  . TOTAL ABDOMINAL HYSTERECTOMY    . TRIGGER FINGER RELEASE Right 10/21/2016   Procedure: RELEASE TRIGGER FINGER/A-1 PULLEY RIGHT RING FINGER;  Surgeon: Daryll Brod, MD;  Location: [place];  Service: Orthopedics;  Laterality: Right;    There were no vitals filed for this visit.  Subjective Assessment - 11/29/18 0851    Subjective  I have not taken a laxitive for 5 days and it seems like the massage helped.  The abdominal pain doesn't go away.  I wasn't hurting as much this morning when I woke up    Patient Stated Goals  get rid of pain and stop having leakage                       [facility] Adult PT Treatment/Exercise - 11/29/18 0001      Neuro Re-ed    Neuro Re-ed Details   biofeedfack      Lumbar Exercises: Seated   Sit to Stand  5 reps   with kegel - had to stop due to pt getting confused   Other Seated Lumbar Exercises  kegel in sitting with and without ball squeeze - had more difficutly engaging the anal sphincters      Lumbar Exercises: Supine   Other Supine Lumbar Exercises  kegel in supine - 10 x 3 sec - needs cues to engage entire pelvic floor      Manual Therapy   Myofascial Release  ascending and descending colon with explanation of how to do this at home               PT Short Term Goals - 11/09/18 1218      PT SHORT TERM GOAL #1   Title  Pt will participate in establishment of initial HEP for improvement with strength deficits    Status  Achieved        PT Long Term Goals - 11/29/18 0856      PT LONG TERM GOAL #1   Title  ind with advanced HEP    Status  On-going      PT LONG TERM GOAL #2   Title  Pt will report feeling at least 50% less wetness throughout the day            Plan - 11/29/18 0935    Clinical Impression Statement  Pt has met or partially met most goals.  Pt needs more time due to difficulty understanding more than one direction at a time causing her to progress more  slowly with HEP and learning how to do kegel exercises correctly.  Pt will benefit from one more visit to ensure successful transition to HEP.    PT Treatment/Interventions  ADLs/Self Care Home Management;Biofeedback;Cryotherapy;Electrical Stimulation;Moist Heat;Therapeutic exercise;Therapeutic activities;Neuromuscular re-education;Patient/family education;Manual techniques;Taping;Dry needling;Passive range of motion    PT Next Visit Plan  final HEP and ensure she isdoing exercises correctly, fascial release to colon and re-assess goals    PT Home Exercise Plan  Access Code: [email or web address]    Consulted and Agree with Plan of Care  Patient       Patient will benefit from skilled therapeutic intervention in order to improve the following deficits and impairments:  Pain, Postural dysfunction, Impaired flexibility, Decreased coordination, Decreased strength  Visit Diagnosis: Muscle weakness (generalized)  Cramp and spasm  Unspecified lack of coordination     Problem List Patient Active Problem List   Diagnosis Date Noted  . Hyperlipidemia 03/31/2016  . Hypertensive heart disease 03/31/2016  . Personal history of transient ischemic attack (TIA), and cerebral infarction without residual deficits 03/31/2016    Jule Ser, PT 11/29/2018, 10:19 AM  [facility] [address] [place], [place], [address] Phone: [phone]   Fax:  [phone]  Name: Christina Austin MRN: [id number] Date of Birth: 1934/10/13  PHYSICAL THERAPY DISCHARGE SUMMARY  Visits from Start of Care: 10  Current functional level related to goals / functional outcomes: See above details   Remaining deficits: See above details   Education / Equipment: HEP  Plan: Patient agrees to discharge.  Patient goals were partially met. Patient is being discharged due to not returning since the last visit.  ?????    [employer], PT 03/14/19 10:44 AM

## 2018-11-30 DIAGNOSIS — M62838 Other muscle spasm: Secondary | ICD-10-CM | POA: Diagnosis not present

## 2018-12-01 DIAGNOSIS — M7661 Achilles tendinitis, right leg: Secondary | ICD-10-CM | POA: Diagnosis not present

## 2018-12-03 ENCOUNTER — Other Ambulatory Visit: Payer: Self-pay

## 2018-12-03 NOTE — Patient Outreach (Signed)
[facility]) Care Management  12/03/2018  Niti Kohtz [facility] [birth year] [id number]   Medication Adherence call to Mrs. Colon with patient she is past due on Atorvasttain 10 mg,patient did not want to engage she had question every ask patient was very hesitance on providing any information.[name] is showing past due under [place]. Patient new tel. # [phone].   [facility] Management Direct Dial [phone]  Fax [phone] [name]

## 2018-12-13 DIAGNOSIS — Z1159 Encounter for screening for other viral diseases: Secondary | ICD-10-CM | POA: Diagnosis not present

## 2018-12-13 DIAGNOSIS — R6889 Other general symptoms and signs: Secondary | ICD-10-CM | POA: Diagnosis not present

## 2018-12-14 ENCOUNTER — Ambulatory Visit: Payer: Medicare Other | Admitting: Physical Therapy

## 2018-12-15 DIAGNOSIS — D127 Benign neoplasm of rectosigmoid junction: Secondary | ICD-10-CM | POA: Diagnosis not present

## 2018-12-15 DIAGNOSIS — D128 Benign neoplasm of rectum: Secondary | ICD-10-CM | POA: Diagnosis not present

## 2018-12-15 DIAGNOSIS — D122 Benign neoplasm of ascending colon: Secondary | ICD-10-CM | POA: Diagnosis not present

## 2018-12-15 DIAGNOSIS — Z86010 Personal history of colonic polyps: Secondary | ICD-10-CM | POA: Diagnosis not present

## 2018-12-17 DIAGNOSIS — D128 Benign neoplasm of rectum: Secondary | ICD-10-CM | POA: Diagnosis not present

## 2018-12-17 DIAGNOSIS — D127 Benign neoplasm of rectosigmoid junction: Secondary | ICD-10-CM | POA: Diagnosis not present

## 2018-12-17 DIAGNOSIS — D122 Benign neoplasm of ascending colon: Secondary | ICD-10-CM | POA: Diagnosis not present

## 2019-01-25 DIAGNOSIS — M62838 Other muscle spasm: Secondary | ICD-10-CM | POA: Diagnosis not present

## 2019-02-08 DIAGNOSIS — M62838 Other muscle spasm: Secondary | ICD-10-CM | POA: Diagnosis not present

## 2019-02-15 DIAGNOSIS — Z961 Presence of intraocular lens: Secondary | ICD-10-CM | POA: Diagnosis not present

## 2019-02-15 DIAGNOSIS — H18413 Arcus senilis, bilateral: Secondary | ICD-10-CM | POA: Diagnosis not present

## 2019-02-15 DIAGNOSIS — H02831 Dermatochalasis of right upper eyelid: Secondary | ICD-10-CM | POA: Diagnosis not present

## 2019-02-15 DIAGNOSIS — H04123 Dry eye syndrome of bilateral lacrimal glands: Secondary | ICD-10-CM | POA: Diagnosis not present

## 2019-02-16 DIAGNOSIS — I10 Essential (primary) hypertension: Secondary | ICD-10-CM | POA: Diagnosis not present

## 2019-02-16 DIAGNOSIS — Z0000 Encounter for general adult medical examination without abnormal findings: Secondary | ICD-10-CM | POA: Diagnosis not present

## 2019-02-17 ENCOUNTER — Ambulatory Visit: Payer: Medicare Other | Admitting: Psychology

## 2019-03-08 ENCOUNTER — Encounter: Payer: Medicare Other | Attending: Psychology | Admitting: Psychology

## 2019-03-08 ENCOUNTER — Other Ambulatory Visit: Payer: Self-pay

## 2019-03-08 ENCOUNTER — Encounter: Payer: Self-pay | Admitting: Psychology

## 2019-03-08 DIAGNOSIS — Z8673 Personal history of transient ischemic attack (TIA), and cerebral infarction without residual deficits: Secondary | ICD-10-CM

## 2019-03-08 DIAGNOSIS — F09 Unspecified mental disorder due to known physiological condition: Secondary | ICD-10-CM | POA: Insufficient documentation

## 2019-03-08 DIAGNOSIS — F0789 Other personality and behavioral disorders due to known physiological condition: Secondary | ICD-10-CM | POA: Diagnosis not present

## 2019-03-08 NOTE — Progress Notes (Signed)
Neuropsychological Consultation   Patient:   Christina Austin    DOB:   1934/03/03  MR Number:  [id number]  Location:  [facility] [place], [place] [id number] [place] [address] Dept: [phone]           Date of Service:   03/08/2019  Start Time:   10 AM End Time:   12 PM  Today's visit was an in person visit and the patient myself were present for this visit.  It was conducted in my outpatient clinic office.  The first hour was conducted utilizing a in person clinical interview and the second hour was review of available medical records and report writing.  Provider/Observer:  Ilean Skill, Psy.D.       Clinical Neuropsychologist       Billing Code/Service: [id number], [phone]  Chief Complaint:    Christina Austin. Addis is an 85 year old female referred by Dr. Lucianne Lei, MD who is her PCP.  The referral was due to ongoing and increasing cognitive changes including more confusion, memory issues, word finding issues and difficulty with mood changes in excepting the increasing limitation she has on her level of independence.  The patient is needing much more assistance by her various family members and it is difficult for her to depend on others.  She also identifies changes in the way they are interacting with her as well.    Reason for Service:  Christina Keltner. Austin is an 85 year old female referred by Dr. Lucianne Lei, MD who is her PCP.  The referral was due to ongoing and increasing cognitive changes including more confusion, memory issues, word finding issues and difficulty with mood changes in excepting the increasing limitation she has on her level of independence.  The patient is needing much more assistance by her various family members and it is difficult for her to depend on others.  She also identifies changes in the way they are interacting with her as well.  The patient has a  medical history that includes history of hypertension and hyperlipidemia, cerebrovascular disease and angina.  The patient has a history of apparent TIA back in 2014 and was seen by [facility] clinic at that time.  The patient had also been seen by Dr. Tomi Likens with [facility] neurology back in 2015 because of ongoing issues with recurrent transient right-sided weakness and facial numbness/facial droop.  Specific causes of this at that time were not identified but considerations included simple partial seizure, migrainous vascular events, or some other etiology.  The patient did have indications of cerebrovascular disease including microvascular ischemic changes in white matter that had been identified back in 2014.  Dr. Tomi Likens did not feel that the recurrent right-sided weakness and facial numbness was related to ongoing TIAs as they were recurrent habitual events extending over the course of several years which would make it unusual for that specific location to repeated with TIA.  The patient did have indications of a prior chronic left internal capsule lacunar infarct.  The patient reports that she has been having a lot of confusion and memory difficulties that appears to be worsening.  She denies any geographic disorientation and did independently make her self to my office today.  The patient reports that she has been having trouble excepting how these cognitive changes are affecting her life and "who and where I am now."  The patient is having difficulty excepting what changes age  is brought in being happy about this and the loss of social connections.  Current COVID-19 restrictions have exacerbated some of these issues.  The patient reports that she was always on top of things and was very self-reliant and is now is having to depend on family members for help and are beginning to treat her differently than they had historically.  The patient feels that her chronological age is catching up with her physical and  cognitive functioning which is a big stress.  The patient reports that she does have a fairly decent diet but is not getting regular physical activity due to plantar fasciitis.  The patient reports that she has disturbed sleep as well.  She reports that she is not taking naps during the day but does not sleep very well at night.  She reports that she tries to get in bed around 10 AM and read in some time she successfully goes to sleep.  Other times she goes into the den to turn on the TV to try to get sleep later.  The patient reports that often she is asleep by 10 but wakes up 2 or 3 times at night to go to the bathroom and sometimes cannot go back to sleep.  She reports that sometimes she is up for several hours.  She wakes up at 7 AM every morning.  She reports that sometimes she is feels rested and other times she remains feeling tired.  The patient is reported to have some snoring but does not appear to have significant risk for sleep apnea.  Reliability of Information: The information is derived from 1 hour face-to-face clinical interview with the patient as well as review of available medical records.  Behavioral Observation: SHERRIANNE JACOME  presents as a 85 y.o.-year-old Right African American Female who appeared her stated age. her dress was Appropriate and she was Well Groomed and her manners were Appropriate to the situation.  her participation was indicative of Appropriate and Redirectable behaviors.  There were any physical disabilities noted related to slow and awkward gait.  she displayed an appropriate level of cooperation and motivation.     Interactions:    Active Appropriate and Redirectable  Attention:   abnormal and attention span appeared shorter than expected for age  Memory:   abnormal; remote memory intact, recent memory impaired  Visuo-spatial:  not examined  Speech (Volume):  normal  Speech:   normal; some apparent word finding issues but no circumlocutions or paraphasic  errors.  Thought Process:  Coherent and Relevant  Though Content:  WNL; not suicidal and not homicidal  Orientation:   person, place, time/date and situation  Judgment:   Good  Planning:   Fair  Affect:    Anxious  Mood:    Anxious  Insight:   Good  Intelligence:   high  Marital Status/Living: The patient was born and raised in [place] with 6 siblings.  The patient's pregnancy with her was described as normal and developmental milestones were reached at the appropriate time.  The patient currently lives with her daughter who is helping her with adjustments.  The patient is divorced.  The patient has been married on 2 separate occasions and her last marriage ended in 2009 after divorce.  The patient has 1 deceased daughter as well as 4 other children.  She does have 1 son who has been diagnosed with schizophrenia.  Current Employment: The patient is retired.  Past Employment:  The patient had several jobs  during her life with longest period of employment being over 12 years.  Substance Use:  No concerns of substance abuse are reported.    Education:   College the patient completed a 3-year college program and maintained a 3.0 grade point average.  Medical History:   Past Medical History:  Diagnosis Date  . Anxiety   . Arthritis    legs, shoulders   . Benign essential HTN   . Chronic kidney disease, stage III (moderate)    chronic kidney disease stage 3  . Depression   . GERD (gastroesophageal reflux disease)   . History of blood transfusion    post hysterectomy  . Hypertensive heart disease without heart failure   . Memory deficit   . Neuromuscular disorder (HCC)    intermittent facial weakness - but at times its severe enough that "I have gone to the hospital", out of state but here in [place]- seen by Dr. Loretta Plume  . Numbness of face   . Other and unspecified hyperlipidemia   . Plantar fasciitis   . Stroke [facility])    TIA  . Unspecified transient cerebral ischemia     takes plavix for 2ndary stroke prevention per Dr. Tomi Likens with neurology             Psychiatric History:  The patient does have a history of anxiety and depressive symptomatology in the past and has had struggles with managing aging and reduced independence.  The patient continues to take Wellbutrin for mood as well as clonazepam for anxiety as needed.  Family Med/Psych History:  Family History  Problem Relation Age of Onset  . Cancer Mother        panceratic    Impression/DX:  Avrianna Apostolopoulos. Chop is an 85 year old female referred by Dr. Lucianne Lei, MD who is her PCP.  The referral was due to ongoing and increasing cognitive changes including more confusion, memory issues, word finding issues and difficulty with mood changes in excepting the increasing limitation she has on her level of independence.  The patient is needing much more assistance by her various family members and it is difficult for her to depend on others.  She also identifies changes in the way they are interacting with her as well.  Disposition/Plan:  The patient has been set up for formal neuropsychological testing.  We will administer the [employer]oteo Expose adult intelligence scale-IV as well as the Wechsler Memory Scale-for for older adults.  We will also look at expressive language with verbal fluency and targeted naming measures.  Diagnosis:    Cognitive and neurobehavioral dysfunction  Personal history of transient ischemic attack (TIA), and cerebral infarction without residual deficits         Electronically Signed   _______________________ Ilean Skill, Psy.D.

## 2019-03-14 ENCOUNTER — Encounter: Payer: Medicare Other | Attending: Psychology | Admitting: Psychology

## 2019-03-14 ENCOUNTER — Other Ambulatory Visit: Payer: Self-pay

## 2019-03-14 ENCOUNTER — Encounter: Payer: Self-pay | Admitting: Psychology

## 2019-03-14 DIAGNOSIS — I10 Essential (primary) hypertension: Secondary | ICD-10-CM | POA: Insufficient documentation

## 2019-03-14 DIAGNOSIS — I679 Cerebrovascular disease, unspecified: Secondary | ICD-10-CM | POA: Diagnosis not present

## 2019-03-14 DIAGNOSIS — F09 Unspecified mental disorder due to known physiological condition: Secondary | ICD-10-CM

## 2019-03-14 DIAGNOSIS — F0789 Other personality and behavioral disorders due to known physiological condition: Secondary | ICD-10-CM | POA: Diagnosis present

## 2019-03-14 DIAGNOSIS — E785 Hyperlipidemia, unspecified: Secondary | ICD-10-CM | POA: Diagnosis not present

## 2019-03-14 DIAGNOSIS — Z8673 Personal history of transient ischemic attack (TIA), and cerebral infarction without residual deficits: Secondary | ICD-10-CM | POA: Diagnosis not present

## 2019-03-14 NOTE — Progress Notes (Addendum)
The patient arrived on time to her 8:00 testing appointment, which lasted 240 minutes.  Behavioral Observations:  Appearance: Casually and appropriately dressed with good hygiene.  Gait: Ambulated independently without assistance.  Speech: Clear, normal rate, tone, and volume. Thought process:  Goal directed, logical, slightly disorganized and inattentive at times.  Mood/Affect: Mildly anxious. Appropriate  Interpersonal: Polite and appropriate.  Orientation: O x 4 Effort/Motivation: Good  She did not appear to have difficulty seeing or understanding test items/questions and did not require much additional prompting. However, she did have some trouble hearing items at times, which may have impacted encoding verbal information, especially list learning (i.e., [facility] [facility] [facility]).   Tests Administered: . Animal Naming  . [place] (BNT) . Controlled Oral Word Association Test (COWAT) . Wechsler Adult Intelligence Scale, 4th Edition (WAIS-IV) . Wechsler Memory Scale, 4th edition, Older Adult Battery (WMS-IV-OA) Results:   BNT . Total=37/60, T=36, 8th %  COWAT . FAS Total=17, T=37, 9th % . Animals Total=10, T=43, 25th %  Composite Score Summary  Scale Sum of Scaled Scores Composite Score Percentile Rank 95% Conf. Interval Qualitative Description  Verbal Comprehension 30 VCI 100 50 94-106 Average  Perceptual Reasoning 31 PRI 102 55 96-108 Average  Working Memory 15 WMI 86 18 80-94 Low Average  Processing Speed 17 PSI 92 30 84-101 Average  Full Scale 93 FSIQ 95 37 91-99 Average  General Ability 61 GAI 101 53 96-106 Average   Index Level Discrepancy Comparisons  Comparison Score 1 Score 2 Difference Critical Value .05 Significant Difference Y/N Base Rate by Overall Sample  VCI - PRI 100 102 -2 9.75 N 45.8  VCI - WMI 100 86 14 8.82 Y 14.1  VCI - PSI 100 92 8 9.75 N 31.5  PRI - WMI 102 86 16 10.99 Y 10.6  PRI - PSI 102 92 10 11.75 N 25.6  WMI - PSI 86 92  -6 10.99 N 35.9  FSIQ - GAI 95 101 -6 3.19 Y 12.4   Differences Between Subtest and Overall Mean of Subtest Scores  Subtest Subtest Scaled Score Mean Scaled Score Difference Critical Value .05 Strength or Weakness Base Rate  Block Design 10 9.30 0.70 2.85  >25%  Similarities 11 9.30 1.70 2.82  >25%  Digit Span 8 9.30 -1.30 2.22  >25%  Matrix Reasoning 11 9.30 1.70 2.54  >25%  Vocabulary 11 9.30 1.70 2.03  >25%  Arithmetic 7 9.30 -2.30 2.73  15-25%  Symbol Search 7 9.30 -2.30 3.42  >25%  Visual Puzzles 10 9.30 0.70 2.71  >25%  Information 8 9.30 -1.30 2.19  >25%  Coding 10 9.30 0.70 2.97  >25%   WMS-IV Older Adult   Brief Cognitive Status Exam Classification  Age Years of Education Raw Score Classification Level Base Rate  84 years 5 months 14 48 Low Average 26.3    Index Score Summary  Index Sum of Scaled Scores Index Score Percentile Rank 95% Confidence Interval Qualitative Descriptor  Auditory Memory (AMI) 28 82 12 77-89 Low Average  Visual Memory (VMI) 18 95 37 90-100 Average  Immediate Memory (IMI) 22 83 13 78-90 Low Average  Delayed Memory (DMI) 24 87 19 80-96 Low Average   Primary Subtest Scaled Score Summary  Subtest Domain Raw Score Scaled Score Percentile Rank  Logical Memory I AM 21 8 25   Logical Memory II AM 13 10 50  [facility] I AM 9 6 9   [facility] II AM 1 4 2  Visual Reproduction I VM 20 8 25   Visual Reproduction II VM 13 10 50  Symbol Span VWM 13 10 50   PROCESS SCORE CONVERSIONS  Auditory Memory Process Score Summary  Process Score Raw Score Scaled Score Percentile Rank Cumulative Percentage (Base Rate)  LM II Recognition 18 - - 51-75%  VPA II Recognition 24 - - 17-25%   Visual Memory Process Score Summary  Process Score Raw Score Scaled Score Percentile Rank Cumulative Percentage (Base Rate)  VR II Recognition 6 - - >75%    ABILITY-MEMORY ANALYSIS  Ability Score:  GAI: 101 Date of Testing:  WAIS-IV; WMS-IV  2019/03/14  Predicted Difference Method   Index Predicted WMS-IV Index Score Actual WMS-IV Index Score Difference Critical Value  Significant Difference Y/N Base Rate  Auditory Memory 101 82 19 9.33 Y 5-10%  Visual Memory 101 95 6 7.72 N   Immediate Memory 101 83 18 10.41 Y 5-10%  Delayed Memory 101 87 14 10.86 Y 15%  Statistical significance (critical value) at the .01 level.   Contrast Scaled Scores  Score Score 1 Score 2 Contrast Scaled Score  General Ability Index vs. Auditory Memory Index 101 82 6  General Ability Index vs. Visual Memory Index 101 95 8  General Ability Index vs. Immediate Memory Index 101 83 5  General Ability Index vs. Delayed Memory Index 101 87 7  Verbal Comprehension Index vs. Auditory Memory Index 100 82 6  Perceptual Reasoning Index vs. Visual Memory Index 102 95 8  Working Memory Index vs. Auditory Memory Index 86 82 7

## 2019-03-28 ENCOUNTER — Other Ambulatory Visit: Payer: Self-pay

## 2019-03-28 ENCOUNTER — Encounter (HOSPITAL_BASED_OUTPATIENT_CLINIC_OR_DEPARTMENT_OTHER): Payer: Medicare Other | Admitting: Psychology

## 2019-03-28 DIAGNOSIS — I679 Cerebrovascular disease, unspecified: Secondary | ICD-10-CM

## 2019-03-28 DIAGNOSIS — Z8673 Personal history of transient ischemic attack (TIA), and cerebral infarction without residual deficits: Secondary | ICD-10-CM | POA: Diagnosis not present

## 2019-03-28 DIAGNOSIS — I10 Essential (primary) hypertension: Secondary | ICD-10-CM | POA: Diagnosis not present

## 2019-03-28 DIAGNOSIS — F09 Unspecified mental disorder due to known physiological condition: Secondary | ICD-10-CM | POA: Diagnosis not present

## 2019-03-28 DIAGNOSIS — E785 Hyperlipidemia, unspecified: Secondary | ICD-10-CM | POA: Diagnosis not present

## 2019-03-29 DIAGNOSIS — M25561 Pain in right knee: Secondary | ICD-10-CM | POA: Diagnosis not present

## 2019-03-29 DIAGNOSIS — M25562 Pain in left knee: Secondary | ICD-10-CM | POA: Diagnosis not present

## 2019-04-02 ENCOUNTER — Encounter: Payer: Self-pay | Admitting: Psychology

## 2019-04-02 NOTE — Progress Notes (Signed)
Neuropsychological Evaluation   Patient:  Christina Austin   DOB: 02-Apr-1934  MR Number: [id number]  Location: [facility] PHYSICAL MEDICINE AND REHABILITATION [place], [id number] [place] [address] Dept: [phone]  Start: 4 PM End: 5 PM  Provider/Observer:     Edgardo Roys PsyD  Chief Complaint:      Chief Complaint  Patient presents with  . Memory Loss  . Anxiety    Reason For Service:     Christina Austin is an 85 year old female referred by Dr. Lucianne Lei, MD who is her PCP.  The referral was due to ongoing and increasing cognitive changes including more confusion, memory issues, word finding issues and difficulty with mood changes and excepting the increasing limitation she has on her level of independence.  The patient is needing much more assistance by her various family members and it is difficult for her to depend on others.  She also identifies changes in the way they are interacting with her as well.  The patient has a medical history that includes history of hypertension and hyperlipidemia, cerebrovascular disease and angina.  The patient has a history of apparent TIA back in 2014 and was seen by [facility] clinic at that time.  The patient had also been seen by Dr. Tomi Likens with the [facility] neurology back in 2015 because of ongoing issues with recurrent transient right-sided weakness and facial numbness/facial droop.  Specific causes of this at that time were not identified but considerations included simple partial seizure, migrainous vascular events, or some other etiology.  The patient did have indications of cerebrovascular disease including microvascular ischemic changes in white matter that had been identified back in 2014.  Dr. Tomi Likens did not feel that the recurrent right-sided weakness and facial numbness was related to ongoing TIAs as they were recurrent habitual events extending over  the course of several years which would make it unusual for that specific location to repeated with TIA.  The patient did have indications of a prior chronic left internal capsule lacunar infarct.  The patient reports that she has been having a lot of confusion and memory difficulties that appears to be worsening.  She denies any geographic disorientation and did independently get her self to my office today.  The patient reports that she has been having trouble excepting how these cognitive changes are affecting her life and "who and where I am now."  The patient is having difficulty excepting what changes age has brought and being happy about this and the loss of social connections.  Current COVID-19 restrictions have exacerbated some of these issues.  The patient reports that she was always on top of things and was very self-reliant and is now is having to depend on family members for help and they are beginning to treat her differently than they had historically.  The patient feels that her chronological age is catching up with her physical and cognitive functioning which is a big stress.  The patient reports that she does have a fairly decent diet but is not getting regular physical activity due to plantar fasciitis.  The patient reports that she has disturbed sleep as well.  She reports that she is not taking naps during the day but does not sleep very well at night.  She reports that she tries to get in bed around 10 AM and read in some time she successfully goes to sleep.  Other times she  goes into the den to turn on the TV to try to get to sleep later.  The patient reports that often she is asleep by 10 but wakes up 2 or 3 times at night to go to the bathroom and sometimes cannot go back to sleep.  She reports that sometimes she is up for several hours.  She wakes up at 7 AM every morning.  She reports that sometimes she is feels rested and other times she remains feeling tired.  The patient is  reported to have some snoring but does not appear to have significant risk for sleep apnea.  Behavioral Observations: Appearance:Casually and appropriately dressed with good hygiene.  Gait:Ambulated independently without assistance.  Speech:Clear, normal rate, tone, and volume. Thought process: Goal directed, logical, slightly disorganized and inattentive at times.  Mood/Affect:Mildly anxious. Appropriate  Interpersonal: Polite and appropriate.  Orientation: O x 4 Effort/Motivation: Good  She did not appear to have difficulty seeing or understanding test items/questions and did not require much additional prompting. However, she did have some trouble hearing items at times, which may have impacted encoding verbal information, especially list learning (i.e., [facility] [facility]).   Tests Administered:  Animal Naming   [place] (BNT)  Controlled Oral Word Association Test (COWAT)  Wechsler Adult Intelligence Scale, 4th Edition (WAIS-IV)  Wechsler Memory Scale, 4th edition, Older Adult Battery (WMS-IV-OA)  Test Results:   Initially, we been establishing a prediction of baseline/premorbid global cognitive and intellectual functioning.  Based on the patient's work history and educational history the patient likely has historically performed in the average to high average range and overall cognitive functioning with some areas of higher functioning individually.  We will utilize a standard T score of 110 as a conservative estimate of historical functioning for comparison.   BNT  Total=37/60, T=36, 8th %  COWAT  FAS Total=17, T=37, 9th %  Animals Total=10, T=43, 25th %  Assessment of the patient's expressive language abilities utilizing both challenge/targeted naming measures as well as verbal fluency measure show that the patient is displaying some mild to moderate weakness with regard to targeted naming and to a lesser extent some mild difficulties with  verbal fluency.  However, neither of these performances were significantly impaired.  The patient appeared to have good receptive language.           Composite Score Summary   Scale Sum of Scaled Scores Composite Score Percentile Rank 95% Conf. Interval Qualitative Description  Verbal Comprehension 30 VCI 100 50 94-106 Average  Perceptual Reasoning 31 PRI 102 55 96-108 Average  Working Memory 15 WMI 86 18 80-94 Low Average  Processing Speed 17 PSI 92 30 84-101 Average  Full Scale 93 FSIQ 95 37 91-99 Average  General Ability 61 GAI 101 53 96-106 Average   While utilizing full-scale IQ score and the other global composite score of general abilities index score for predicting historical functioning is quite limited as the patient is describing some significant decrease in overall cognitive functioning and therefore the patient's full-scale IQ scores likely been impacted and should not be seen as a consistent measure with lifelong functioning.  The patient produced a full-scale IQ score of 95 which falls at the 37th percentile and is in the average range.  This is mildly below predicted levels based on the patient's educational and occupational history and does suggest 1 or more domains are currently being adversely impacted.  The patient produced a general abilities index score of 101 which falls  at the 53rd percentile and is in the average range.  This is only slightly below predicted levels.  This measure places less emphasis on the most acutely impacted measures and this comprehensive battery of cognitive measures.  This index places less emphasis on working memory and information processing speed variables which were both her lowest 2 areas of performance.  The patient produced a verbal comprehension index score of 100 which falls at the 50th percentile and is in the average range.  This is only slightly below predicted levels and indicates that she is maintaining and preserving overall verbal  comprehensive skills.  Individual subtest making up these measure show that the patient is doing well on measures of verbal reasoning and problem-solving, her general vocabulary skills, and only mild weakness on her general fund of information although this area may have historically not been one of her highest levels of performance.  The patient produced a perceptual reasoning index score of 102 which falls at the 55th percentile.  This performance falls in the average range relative to a normative population.  This pattern suggest visual spatial and visual constructional abilities are still within normal limits and remain intact.  Individual items making up this measure show that the patient is doing well on visual analysis and organizational skills visual reasoning and problem solving skills and visual estimation and judgment skills.  The patient produced a working memory index score of 86 which falls at the 18th percentile and is in the low average range of functioning.  This is significantly below predicted levels and suggest that issues related to auditory encoding are mildly impaired and below historical functioning for the patient.  The patient had difficulty both on pure auditory encoding measures as well as initially processing auditorily encoded information.  The patient produced a processing speed index score of 92 which falls at the 30th percentile and is in the average range.  This is approximately 18 points below predicted levels and does suggest there is some reduction in overall information processing speed, visual scanning and visual searching abilities.  Individual subtest making up this domain do suggest some difficulties with visual scanning primarily horizontally and the patient performed in the average range on vertically visual scanning measures.  The patient's vertical scanning was within normal limits and does suggest that the primary issue has to do with potentially motor functioning  for visual scanning versus a significant reduction in information processing speed.          Differences Between Subtest and Overall Mean of Subtest Scores   Subtest Subtest Scaled Score Mean Scaled Score Difference Critical Value .05 Strength or Weakness Base Rate  Block Design 10 9.30 0.70 2.85  >25%  Similarities 11 9.30 1.70 2.82  >25%  Digit Span 8 9.30 -1.30 2.22  >25%  Matrix Reasoning 11 9.30 1.70 2.54  >25%  Vocabulary 11 9.30 1.70 2.03  >25%  Arithmetic 7 9.30 -2.30 2.73  15-25%  Symbol Search 7 9.30 -2.30 3.42  >25%  Visual Puzzles 10 9.30 0.70 2.71  >25%  Information 8 9.30 -1.30 2.19  >25%  Coding 10 9.30 0.70 2.97  >25%     WMS-IV Older Adult         Brief Cognitive Status Exam Classification   Age Years of Education Raw Score Classification Level Base Rate  84 years 5 months 14 48 Low Average 26.3    Index Score Summary   Index Sum of Scaled Scores Index Score Percentile Rank 95% Confidence  Interval Qualitative Descriptor  Auditory Memory (AMI) 28 82 12 77-89 Low Average  Visual Memory (VMI) 18 95 37 90-100 Average  Immediate Memory (IMI) 22 83 13 78-90 Low Average  Delayed Memory (DMI) 24 87 19 80-96 Low Average   The patient's auditory and visual encoding measures derived from both the WMS-IV as well as the WAIS-IV suggest that the patient is having mild difficulties with auditory encoding but her visual encoding appears to be within normal limits.  The patient was then administered the Wechsler Memory Scale-for for older adults.  Breaking the patient's memory down between auditory versus visual memory the patient produced an auditory memory index score of 82 which falls at the 12 percentile and is in the low average range overall.  This is significantly below predicted levels of historical functioning and does suggest auditory memory is quite problematic for the patient.  However, there are only very mild weaknesses with regard to visual memory  and she produced a visual memory index score of 95 which falls at the 37th percentile and is in the average range.  This difference between auditory versus visual memory is likely related to greater difficulties with auditory encoding versus visual encoding.  Breaking memory components down between immediate versus delayed memory suggest that the patient is having difficulty both with immediate memory as well as delayed memory.  The patient produced an immediate memory index score of 83 which falls at the 13th percentile and is in the low average range.  While the patient retained the majority of information she initially learned and was able to recall her delayed memory index did continue to be impaired as she produced a delayed memory index score of 87 which fell at the 19th percentile in the low average range. The patient showed some mild decrease in information processing speed but it was primarily related to visual scanning that required horizontal scanning The patient did much better on recognition and cued recall formats suggesting that some of the difficulties that she is having are primarily related to initial difficulties with encoding information and then retrieval of information rather than a fundamental inability to to actually store and organize learned information.         Primary Subtest Scaled Score Summary   Subtest Domain Raw Score Scaled Score Percentile Rank  Logical Memory I AM 21 8 25   Logical Memory II AM 13 10 50  [facility] I AM 9 6 9   Verbal [facility] [facility] II AM 1 4 2   Visual Reproduction I VM 20 8 25   Visual Reproduction II VM 13 10 50  Symbol Span VWM 13 10 50    Summary of Results:   Overall, the results of the current neuropsychological evaluation are consistent with the patient's subjective reports of ongoing cognitive difficulties.  While the patient showed only mild changes in expressive language receptive language was quite good.  The patient  also did well on measures of visual-spatial and visual constructional skills, retaining most of her verbal comprehension skills and verbal reasoning abilities, as well as measures of visual encoding and visual memory.  The patient's information processing speed appeared primarily related to visual scanning reductions primarily with horizontal visual scanning.  The patient's memory difficulties were primarily for auditory memory issues and within the auditory memory process her primarily weaknesses had to do with difficulties with auditory encoding and retrieval of information versus an inability to store and organize that information.  Impression/Diagnosis:   Overall, the results of  the current neuropsychological evaluation are consistent with some lateralization of deficits primarily focusing on left hemisphere functioning.  The patient's cognitive weaknesses for memory were primarily related to auditory encoding and free recall of information with the patient improving on recognition formats suggesting she is able to adequately store and organize information but has difficulty retrieving that information and a free recall capacity.  Overall, these neuropsychological strengths and weaknesses do primarily suggest that the issues are related to cerebrovascular changes related to microvascular ischemic changes mostly in the left hemisphere and potentially some TIA events in the left hemisphere and/or seizure events in the left hemisphere.  There are no indications of patterns consistent with progressive cortical dementia such as Alzheimer's or Lewy body etc.  I will provide feedback to the patient regarding the results of this objective evaluation and we will work on ways to better cope with some of the resulting changes that she is experiencing and reductions in functional capacity.  Diagnosis:    Personal history of TIA (transient ischemic attack)  Cerebrovascular small vessel disease   Ilean Skill,  Psy.D. Neuropsychologist

## 2019-04-04 ENCOUNTER — Encounter: Payer: Medicare Other | Admitting: Psychology

## 2019-04-04 ENCOUNTER — Encounter: Payer: Self-pay | Admitting: Psychology

## 2019-04-04 ENCOUNTER — Other Ambulatory Visit: Payer: Self-pay

## 2019-04-04 DIAGNOSIS — I679 Cerebrovascular disease, unspecified: Secondary | ICD-10-CM | POA: Diagnosis not present

## 2019-04-04 DIAGNOSIS — I10 Essential (primary) hypertension: Secondary | ICD-10-CM | POA: Diagnosis not present

## 2019-04-04 DIAGNOSIS — E785 Hyperlipidemia, unspecified: Secondary | ICD-10-CM | POA: Diagnosis not present

## 2019-04-04 DIAGNOSIS — Z8673 Personal history of transient ischemic attack (TIA), and cerebral infarction without residual deficits: Secondary | ICD-10-CM

## 2019-04-04 DIAGNOSIS — F09 Unspecified mental disorder due to known physiological condition: Secondary | ICD-10-CM

## 2019-04-04 DIAGNOSIS — F0789 Other personality and behavioral disorders due to known physiological condition: Secondary | ICD-10-CM | POA: Diagnosis not present

## 2019-04-04 NOTE — Progress Notes (Signed)
04/04/2019.  Today I provided feedback to the patient and her daughter regarding the results of the recent neuropsychological evaluation.  We went over the results of the testing and implications of the testing.  The patient was well oriented today and appeared to be able to fully understand and comprehend what was being reviewed.  I provided a copy of the complete formal report to the patient and her daughter as well as let them know it was available in my chart.  The complete neuropsychological evaluation can be found in the patient's medical records dated 03/28/2019.  Below you will find the impression/diagnosis from that evaluation.    Summary of Results:                        Overall, the results of the current neuropsychological evaluation are consistent with the patient's subjective reports of ongoing cognitive difficulties.  While the patient showed only mild changes in expressive language receptive language was quite good.  The patient also did well on measures of visual-spatial and visual constructional skills, retaining most of her verbal comprehension skills and verbal reasoning abilities, as well as measures of visual encoding and visual memory.  The patient's information processing speed appeared primarily related to visual scanning reductions primarily with horizontal visual scanning.  The patient's memory difficulties were primarily for auditory memory issues and within the auditory memory process her primarily weaknesses had to do with difficulties with auditory encoding and retrieval of information versus an inability to store and organize that information.  Impression/Diagnosis:                     Overall, the results of the current neuropsychological evaluation are consistent with some lateralization of deficits primarily focusing on left hemisphere functioning.  The patient's cognitive weaknesses for memory were primarily related to auditory encoding and free recall of information with  the patient improving on recognition formats suggesting she is able to adequately store and organize information but has difficulty retrieving that information and a free recall capacity.  Overall, these neuropsychological strengths and weaknesses do primarily suggest that the issues are related to cerebrovascular changes related to microvascular ischemic changes mostly in the left hemisphere and potentially some TIA events in the left hemisphere and/or seizure events in the left hemisphere.  There are no indications of patterns consistent with progressive cortical dementia such as Alzheimer's or Lewy body etc.  I will provide feedback to the patient regarding the results of this objective evaluation and we will work on ways to better cope with some of the resulting changes that she is experiencing and reductions in functional capacity.  Diagnosis:                               Personal history of TIA (transient ischemic attack)  Cerebrovascular small vessel disease   Ilean Skill, Psy.D. Neuropsychologist

## 2019-04-26 DIAGNOSIS — H903 Sensorineural hearing loss, bilateral: Secondary | ICD-10-CM | POA: Diagnosis not present

## 2019-04-29 DIAGNOSIS — M25561 Pain in right knee: Secondary | ICD-10-CM | POA: Diagnosis not present

## 2019-05-19 DIAGNOSIS — H905 Unspecified sensorineural hearing loss: Secondary | ICD-10-CM | POA: Diagnosis not present

## 2019-05-22 ENCOUNTER — Observation Stay (HOSPITAL_COMMUNITY)
Admission: EM | Admit: 2019-05-22 | Discharge: 2019-05-23 | Disposition: A | Discharge status: Home / Self Care | Payer: Medicare Other | Attending: Internal Medicine | Admitting: Internal Medicine

## 2019-05-22 ENCOUNTER — Emergency Department (HOSPITAL_COMMUNITY): Payer: Medicare Other

## 2019-05-22 ENCOUNTER — Encounter (HOSPITAL_COMMUNITY): Payer: Self-pay | Admitting: Emergency Medicine

## 2019-05-22 ENCOUNTER — Other Ambulatory Visit: Payer: Self-pay

## 2019-05-22 DIAGNOSIS — Z8673 Personal history of transient ischemic attack (TIA), and cerebral infarction without residual deficits: Secondary | ICD-10-CM | POA: Diagnosis not present

## 2019-05-22 DIAGNOSIS — R519 Headache, unspecified: Secondary | ICD-10-CM | POA: Insufficient documentation

## 2019-05-22 DIAGNOSIS — K219 Gastro-esophageal reflux disease without esophagitis: Secondary | ICD-10-CM | POA: Diagnosis not present

## 2019-05-22 DIAGNOSIS — W19XXXA Unspecified fall, initial encounter: Secondary | ICD-10-CM | POA: Diagnosis not present

## 2019-05-22 DIAGNOSIS — I6603 Occlusion and stenosis of bilateral middle cerebral arteries: Secondary | ICD-10-CM | POA: Diagnosis not present

## 2019-05-22 DIAGNOSIS — F419 Anxiety disorder, unspecified: Secondary | ICD-10-CM | POA: Insufficient documentation

## 2019-05-22 DIAGNOSIS — I6782 Cerebral ischemia: Secondary | ICD-10-CM | POA: Diagnosis not present

## 2019-05-22 DIAGNOSIS — I6381 Other cerebral infarction due to occlusion or stenosis of small artery: Secondary | ICD-10-CM | POA: Insufficient documentation

## 2019-05-22 DIAGNOSIS — Z90722 Acquired absence of ovaries, bilateral: Secondary | ICD-10-CM | POA: Diagnosis not present

## 2019-05-22 DIAGNOSIS — F329 Major depressive disorder, single episode, unspecified: Secondary | ICD-10-CM | POA: Insufficient documentation

## 2019-05-22 DIAGNOSIS — Z800 Family history of malignant neoplasm of digestive organs: Secondary | ICD-10-CM | POA: Insufficient documentation

## 2019-05-22 DIAGNOSIS — Z90710 Acquired absence of both cervix and uterus: Secondary | ICD-10-CM | POA: Diagnosis not present

## 2019-05-22 DIAGNOSIS — Z886 Allergy status to analgesic agent status: Secondary | ICD-10-CM | POA: Insufficient documentation

## 2019-05-22 DIAGNOSIS — E785 Hyperlipidemia, unspecified: Secondary | ICD-10-CM | POA: Insufficient documentation

## 2019-05-22 DIAGNOSIS — M722 Plantar fascial fibromatosis: Secondary | ICD-10-CM | POA: Diagnosis not present

## 2019-05-22 DIAGNOSIS — R52 Pain, unspecified: Secondary | ICD-10-CM | POA: Diagnosis not present

## 2019-05-22 DIAGNOSIS — M1990 Unspecified osteoarthritis, unspecified site: Secondary | ICD-10-CM | POA: Insufficient documentation

## 2019-05-22 DIAGNOSIS — Z79899 Other long term (current) drug therapy: Secondary | ICD-10-CM | POA: Diagnosis not present

## 2019-05-22 DIAGNOSIS — Z7902 Long term (current) use of antithrombotics/antiplatelets: Secondary | ICD-10-CM | POA: Insufficient documentation

## 2019-05-22 DIAGNOSIS — N183 Chronic kidney disease, stage 3 unspecified: Secondary | ICD-10-CM | POA: Diagnosis not present

## 2019-05-22 DIAGNOSIS — I10 Essential (primary) hypertension: Secondary | ICD-10-CM | POA: Diagnosis not present

## 2019-05-22 DIAGNOSIS — I1310 Hypertensive heart and chronic kidney disease without heart failure, with stage 1 through stage 4 chronic kidney disease, or unspecified chronic kidney disease: Secondary | ICD-10-CM | POA: Diagnosis not present

## 2019-05-22 DIAGNOSIS — R000 Tachycardia, unspecified: Secondary | ICD-10-CM | POA: Diagnosis not present

## 2019-05-22 DIAGNOSIS — I739 Peripheral vascular disease, unspecified: Secondary | ICD-10-CM | POA: Diagnosis not present

## 2019-05-22 DIAGNOSIS — Z791 Long term (current) use of non-steroidal anti-inflammatories (NSAID): Secondary | ICD-10-CM | POA: Insufficient documentation

## 2019-05-22 DIAGNOSIS — I672 Cerebral atherosclerosis: Secondary | ICD-10-CM | POA: Diagnosis not present

## 2019-05-22 DIAGNOSIS — G459 Transient cerebral ischemic attack, unspecified: Secondary | ICD-10-CM | POA: Diagnosis not present

## 2019-05-22 DIAGNOSIS — R531 Weakness: Secondary | ICD-10-CM | POA: Diagnosis not present

## 2019-05-22 DIAGNOSIS — I639 Cerebral infarction, unspecified: Secondary | ICD-10-CM

## 2019-05-22 DIAGNOSIS — I071 Rheumatic tricuspid insufficiency: Secondary | ICD-10-CM | POA: Insufficient documentation

## 2019-05-22 DIAGNOSIS — Z20822 Contact with and (suspected) exposure to covid-19: Secondary | ICD-10-CM | POA: Insufficient documentation

## 2019-05-22 DIAGNOSIS — I6623 Occlusion and stenosis of bilateral posterior cerebral arteries: Secondary | ICD-10-CM | POA: Diagnosis not present

## 2019-05-22 DIAGNOSIS — R29818 Other symptoms and signs involving the nervous system: Secondary | ICD-10-CM | POA: Diagnosis not present

## 2019-05-22 LAB — I-STAT CHEM 8, ED
BUN: 19 mg/dL (ref 8–23)
Calcium, Ion: 1.16 mmol/L (ref 1.15–1.40)
Chloride: 105 mmol/L (ref 98–111)
Creatinine, Ser: 1.1 mg/dL — ABNORMAL HIGH (ref 0.44–1.00)
Glucose, Bld: 86 mg/dL (ref 70–99)
HCT: 45 % (ref 36.0–46.0)
Hemoglobin: 15.3 g/dL — ABNORMAL HIGH (ref 12.0–15.0)
Potassium: 3.6 mmol/L (ref 3.5–5.1)
Sodium: 142 mmol/L (ref 135–145)
TCO2: 27 mmol/L (ref 22–32)

## 2019-05-22 LAB — COMPREHENSIVE METABOLIC PANEL
ALT: 21 U/L (ref 0–44)
AST: 24 U/L (ref 15–41)
Albumin: 3.7 g/dL (ref 3.5–5.0)
Alkaline Phosphatase: 98 U/L (ref 38–126)
Anion gap: 11 (ref 5–15)
BUN: 17 mg/dL (ref 8–23)
CO2: 26 mmol/L (ref 22–32)
Calcium: 9.5 mg/dL (ref 8.9–10.3)
Chloride: 106 mmol/L (ref 98–111)
Creatinine, Ser: 1.13 mg/dL — ABNORMAL HIGH (ref 0.44–1.00)
GFR calc Af Amer: 52 mL/min — ABNORMAL LOW (ref >60)
GFR calc non Af Amer: 45 mL/min — ABNORMAL LOW (ref >60)
Glucose, Bld: 97 mg/dL (ref 70–99)
Potassium: 3.7 mmol/L (ref 3.5–5.1)
Sodium: 143 mmol/L (ref 135–145)
Total Bilirubin: 0.6 mg/dL (ref 0.3–1.2)
Total Protein: 7.3 g/dL (ref 6.5–8.1)

## 2019-05-22 LAB — CBC
HCT: 41.6 % (ref 36.0–46.0)
Hemoglobin: 13.2 g/dL (ref 12.0–15.0)
MCH: 29.5 pg (ref 26.0–34.0)
MCHC: 31.7 g/dL (ref 30.0–36.0)
MCV: 93.1 fL (ref 80.0–100.0)
Platelets: 241 10*3/uL (ref 150–400)
RBC: 4.47 MIL/uL (ref 3.87–5.11)
RDW: 13.1 % (ref 11.5–15.5)
WBC: 5.8 10*3/uL (ref 4.0–10.5)
nRBC: 0 % (ref 0.0–0.2)

## 2019-05-22 LAB — DIFFERENTIAL
Abs Immature Granulocytes: 0.01 10*3/uL (ref 0.00–0.07)
Basophils Absolute: 0 10*3/uL (ref 0.0–0.1)
Basophils Relative: 1 %
Eosinophils Absolute: 0.1 10*3/uL (ref 0.0–0.5)
Eosinophils Relative: 1 %
Immature Granulocytes: 0 %
Lymphocytes Relative: 27 %
Lymphs Abs: 1.6 10*3/uL (ref 0.7–4.0)
Monocytes Absolute: 0.6 10*3/uL (ref 0.1–1.0)
Monocytes Relative: 10 %
Neutro Abs: 3.5 10*3/uL (ref 1.7–7.7)
Neutrophils Relative %: 61 %

## 2019-05-22 LAB — PROTIME-INR
INR: 1 (ref 0.8–1.2)
Prothrombin Time: 12.8 seconds (ref 11.4–15.2)

## 2019-05-22 LAB — RESPIRATORY PANEL BY RT PCR (FLU A&B, COVID)
Influenza A by PCR: NEGATIVE
Influenza B by PCR: NEGATIVE
SARS Coronavirus 2 by RT PCR: NEGATIVE

## 2019-05-22 LAB — CBG MONITORING, ED: Glucose-Capillary: 82 mg/dL (ref 70–99)

## 2019-05-22 LAB — APTT: aPTT: 34 seconds (ref 24–36)

## 2019-05-22 MED ORDER — ACETAMINOPHEN 160 MG/5ML PO SOLN: 650.0000 mg | ORAL | Status: DC | PRN

## 2019-05-22 MED ORDER — ACETAMINOPHEN 325 MG PO TABS
650.0000 mg | ORAL_TABLET | ORAL | Status: DC | PRN
  Administered 2019-05-23 (×2): 650 mg via ORAL
  Filled 2019-05-22 (×2): qty 2

## 2019-05-22 MED ORDER — HYDRALAZINE HCL 20 MG/ML IJ SOLN: 10.0000 mg | INTRAMUSCULAR | Status: DC | PRN

## 2019-05-22 MED ORDER — PROMETHAZINE HCL 25 MG/ML IJ SOLN
12.5000 mg | Freq: Once | INTRAMUSCULAR | Status: AC
  Administered 2019-05-22: 12.5 mg via INTRAVENOUS
  Filled 2019-05-22: qty 1

## 2019-05-22 MED ORDER — ATORVASTATIN CALCIUM 10 MG PO TABS
10.0000 mg | ORAL_TABLET | Freq: Every day | ORAL | Status: DC
  Administered 2019-05-23: 10 mg via ORAL
  Filled 2019-05-22: qty 1

## 2019-05-22 MED ORDER — BUPROPION HCL ER (XL) 300 MG PO TB24
300.0000 mg | ORAL_TABLET | Freq: Every day | ORAL | Status: DC
  Administered 2019-05-23: 300 mg via ORAL
  Filled 2019-05-22: qty 1

## 2019-05-22 MED ORDER — CITALOPRAM HYDROBROMIDE 10 MG PO TABS
5.0000 mg | ORAL_TABLET | Freq: Every day | ORAL | Status: DC
  Administered 2019-05-22: 5 mg via ORAL
  Filled 2019-05-22 (×2): qty 1

## 2019-05-22 MED ORDER — SODIUM CHLORIDE 0.9 % IV SOLN: INTRAVENOUS | Status: DC

## 2019-05-22 MED ORDER — ACETAMINOPHEN 650 MG RE SUPP: 650.0000 mg | RECTAL | Status: DC | PRN

## 2019-05-22 MED ORDER — CLOPIDOGREL BISULFATE 75 MG PO TABS
75.0000 mg | ORAL_TABLET | Freq: Every day | ORAL | Status: DC
  Administered 2019-05-23: 75 mg via ORAL
  Filled 2019-05-22: qty 1

## 2019-05-22 MED ORDER — STROKE: EARLY STAGES OF RECOVERY BOOK
Freq: Once | Status: DC
  Filled 2019-05-22: qty 1

## 2019-05-22 MED ORDER — ENOXAPARIN SODIUM 40 MG/0.4ML ~~LOC~~ SOLN
40.0000 mg | Freq: Every day | SUBCUTANEOUS | Status: DC
  Administered 2019-05-23: 40 mg via SUBCUTANEOUS
  Filled 2019-05-22: qty 0.4

## 2019-05-22 MED ORDER — SODIUM CHLORIDE 0.9% FLUSH
3.0000 mL | Freq: Once | INTRAVENOUS | Status: AC
  Administered 2019-05-22: 3 mL via INTRAVENOUS

## 2019-05-22 NOTE — Consult Note (Addendum)
Referring Physician: Dr. Tamera Punt    Chief Complaint: Acute onset of subjective right sided weakness.   HPI: Christina Austin is an 85 y.o. female with prior history of HTN, CKD, HLD and TIA, who presents today via EMS after experiencing acute onset of RUE and RLE weakness in conjunction with right sided headache. EMS was called, and on arrival no neurological deficits were noted on their exam, while patient continued to be symptomatic on the right. CBG was 109. BP was 210/100. Of note, headache was also experienced 2 days ago.   She has previously seen Dr. Tomi Likens for symptoms of recurrent facial numbness. Her last visit with him was on 08/12/13. Per his note on that date: "She's had these episodes for over 10 years. She will describe onset of right facial numbness. Sometimes it is briefly associated with right facial weakness as well. It can occur anywhere from one hour to an entire day. She cannot really tell me how frequent she gets them. However, she tends to have periods when they become more frequent. On rare occasions, it has been associated with headache, described as right-sided, and pressure-like. It was not associated with nausea, photophobia, or phonophobia. She has no prior history is of migraine. She has seen other neurologists in the past and has had an unremarkable workup. Most recently, she had no workup performed at the [facility] clinic.  She was admitted to the [facility] on 11/03/12 for what was diagnosed as a TIA.  She had developed sensation of right sided facial warmth, then right-sided facial droop and right upper extremity weakness, lasting less than 5 minutes.  Work up included:  CT Head:  chronic left internal capsule lacunar infarct, but no acute abnormalities. MRI Brain w/o:  small vessel ischemic changes.  No acute intracranial abnormalities. MRA Head & Neck:  No intracranial or extracranial stenosis. 2D Echo:  EF 64 +/- 65%, no source of embolus LABS:  Hgb A1c 6.0, est avg  glucose 126, LDL 54, INR 1.0 EEG:  intermittent slowing in the left temporal region, "suggestive of a cortical dysfunction in the left temporal region.  No epileptiform activity or EEG seizures were noted." EKG:  no ST/T changes. CXR:  Unremarkable."  LSN: 1200 tPA Given: No: Minimal symptoms.   Past Medical History:  Diagnosis Date  . Anxiety   . Arthritis    legs, shoulders   . Benign essential HTN   . Chronic kidney disease, stage III (moderate)    chronic kidney disease stage 3  . Depression   . GERD (gastroesophageal reflux disease)   . History of blood transfusion    post hysterectomy  . Hypertensive heart disease without heart failure   . Memory deficit   . Neuromuscular disorder (HCC)    intermittent facial weakness - but at times its severe enough that "I have gone to the hospital", [place] but here in [place]- seen by Dr. Loretta Plume  . Numbness of face   . Other and unspecified hyperlipidemia   . Plantar fasciitis   . Stroke [facility])    TIA  . Unspecified transient cerebral ischemia    takes plavix for 2ndary stroke prevention per Dr. Tomi Likens with neurology    Past Surgical History:  Procedure Laterality Date  . BILATERAL SALPINGOOPHORECTOMY    . BREAST LUMPECTOMY WITH RADIOACTIVE SEED LOCALIZATION Right 08/02/2015   Procedure: BREAST LUMPECTOMY WITH RADIOACTIVE SEED LOCALIZATION;  Surgeon: [name], MD;  Location: [place];  Service: General;  Laterality: Right;  .  colonoscopy     removed polyps- to f/u in 6 months  . TOTAL ABDOMINAL HYSTERECTOMY    . TRIGGER FINGER RELEASE Right 10/21/2016   Procedure: RELEASE TRIGGER FINGER/A-1 PULLEY RIGHT RING FINGER;  Surgeon: Daryll Brod, MD;  Location: [place];  Service: Orthopedics;  Laterality: Right;    Family History  Problem Relation Age of Onset  . Cancer Mother        panceratic   Social History:  reports that she has never smoked. She has never used smokeless tobacco. She reports that she does  not drink alcohol or use drugs.  Allergies:  Allergies  Allergen Reactions  . Aspirin     GI Bleed     HTN Medications:  No current facility-administered medications on file prior to encounter.   Current Outpatient Medications on File Prior to Encounter  Medication Sig Dispense Refill  . amLODipine (NORVASC) 10 MG tablet Take 10 mg by mouth every evening.     Marland Kitchen atorvastatin (LIPITOR) 40 MG tablet Take 20 mg by mouth every evening.    Marland Kitchen buPROPion (WELLBUTRIN XL) 300 MG 24 hr tablet Take 300 mg by mouth every morning.    . clonazePAM (KLONOPIN) 0.5 MG tablet Take 0.5 mg by mouth daily.    . clopidogrel (PLAVIX) 75 MG tablet Take 75 mg by mouth daily.    . hydrochlorothiazide (HYDRODIURIL) 25 MG tablet Take 25 mg by mouth every evening.     . mirabegron ER (MYRBETRIQ) 25 MG TB24 tablet Take 25 mg by mouth daily.    . Multiple Vitamin (MULTI VITAMIN PO) Take 1 tablet by mouth daily.    Marland Kitchen omeprazole (PRILOSEC) 20 MG capsule Take 20 mg by mouth daily as needed (heartburn).    . polyethylene glycol (MIRALAX / GLYCOLAX) packet Take 17 g by mouth daily.    . traMADol (ULTRAM) 50 MG tablet Take 1 tablet (50 mg total) by mouth every 6 (six) hours as needed. 20 tablet 0  . vitamin B-12 (CYANOCOBALAMIN) 100 MCG tablet Take 100 mcg by mouth daily.      ROS: As per HPI. Comprehensive ROS otherwise negative, including no vision changes, trouble speaking, CP, SOB, fever, abdominal pain, or trouble with urination.   Physical Examination: There were no vitals taken for this visit.  HEENT: [place]/AT Lungs: Respirations unlabored Ext: No edema  Neurologic Examination: Mental Status: Awake, alert and oriented x 5. Speech fluent with intact comprehension, naming and repetition.  Cranial Nerves: II:  Visual fields intact. No extinction to DSS. PERRL.  III,IV, VI: No ptosis. EOMI.  V,VII: No facial droop. Temp sensation equal bilaterally  VIII: HOH IX,X: Palate elevates symmetrically XI: Symmetric  shoulder shrug XII: midline tongue extension  Motor: Right : Upper extremity   5/5    Left:     Upper extremity   5/5  Lower extremity   5/5     Lower extremity   5/5 No pronator drift.  Tone and bulk normal x 4.  Sensory: Temp and light touch intact throughout, bilaterally Deep Tendon Reflexes:  2+ bilateral biceps, brachioradialis and patellae Plantars: Right: downgoing   Left: downgoing Cerebellar: No ataxia with FNF bilaterally  Gait: Deferred  Results for orders placed or performed during the hospital encounter of 05/22/19 (from the past 48 hour(s))  CBG monitoring, ED     Status: None   Collection Time: 05/22/19  2:00 PM  Result Value Ref Range   Glucose-Capillary 82 70 - 99 mg/dL  Comment: Glucose reference range applies only to samples taken after fasting for at least 8 hours.   No results found.  Assessment: 85 y.o. female presenting with acute onset of severe right sided headache radiating to the neck and right upper shoulder, in association with proximal RUE weakness.  1. CT head shows a punctate hypodensity in the left internal capsule that is of uncertain significance. May represent a subacute lacunar infarction per Radiology, but also has the appearance of a dilated Virchow-Robin space or neuroglial cyst on review of images by Neurology. Also, her work up at the [facility] several years ago mentioned an infarction in the left internal capsule; the finding seen on today's CT exam is therefore most likely old.  2. Exam is nonfocal.  3. Stroke risk factors:  4. Stroke Risk Factors - HTN, HLD and TIA.  5. ASA allergy  Plan: 1. MRI brain and MRA of head without contrast (ordered) 2. MRA of neck without contrast to r/o dissection (ordered) 3. Telemetry monitoring in the ED.  4. Continue Plavix 5. Further recommendations pending MRI results.   Addendum: -- No acute infarction on MRI brain. Old lacunar infarctions are noted.  -- No LVO on MRA head. There is  intracranial atherosclerotic disease with multifocal stenoses.  -- MRA neck: No dissection. There is narrowing at the origin of the right ICA which may approach or exceed 70%. Carotid artery duplex is recommended for further evaluation. Also noted are probable stenoses at the origin of the right vertebral artery and within the proximal V1 left vertebral artery which are poorly quantified due to non-contrast MRA technique. No appreciable significant stenosis more distally within the imaged cervical vertebral arteries -- Will need a TTE.  -- Continue Plavix   @Electronically  signed: Dr. Kerney Elbe  05/22/2019, 2:10 PM

## 2019-05-22 NOTE — ED Notes (Signed)
Headache continues

## 2019-05-22 NOTE — ED Notes (Signed)
Pt returned from mri alert oriented family at  The bedside

## 2019-05-22 NOTE — ED Triage Notes (Signed)
Arrived via EMS for CODE STROKE. LKW 1200 with right side headache, right side sensation decreased and weakness. Alert answering and following commands appropriate upon arrival.

## 2019-05-22 NOTE — H&P (Signed)
History and Physical    JOSEPHA TOUSEY [id number] DOB: May 12, 1934 DOA: 05/22/2019  PCP: Lucianne Lei, MD  Patient coming from: Home.  Chief Complaint: Headache and right-sided numbness and weakness.  HPI: Christina Austin is a 85 y.o. female with history of hypertension, depression, hyperlipidemia presents to the ER after patient had worsening headache and some weakness of the right upper and lower extremity.  Patient states her symptoms started on May 20, 2019 about 3 days ago when patient started having severe headache in the right side of scalp with involving the face and neck.  This lasted for few hours and resolved without any intervention.  At that time patient did not have any visual symptoms or weakness.  Then this afternoon around 12 PM patient's headache again started recurring.  This time patient also felt that she may have had some soreness or weakness of the right upper and lower extremity.  Lasted almost an hour at this point patient called EMS and was brought to the ER.  Did not have any difficulty with swallowing speaking or any visual symptoms.  No symptoms on the left side.  Headache was dull persistent involving the right side of the face and scalp and neck.  Not as intense as the one happened 2 days ago.  Did not have any fever chills.  ED Course: In the ER patient was appearing nonfocal.  CT head MRI brain MRA brain and neck did not show any large vessel obstruction or acute stroke.  Given the symptoms neurology at this time who was consulted requested admission for TIA work-up.  Covid test was negative.  EKG shows normal sinus rhythm.  Labs show creatinine 1.1 CBC was unremarkable.  Review of Systems: As per HPI, rest all negative.   Past Medical History:  Diagnosis Date  . Anxiety   . Arthritis    legs, shoulders   . Benign essential HTN   . Chronic kidney disease, stage III (moderate)    chronic kidney disease stage 3  . Depression   . GERD (gastroesophageal reflux  disease)   . History of blood transfusion    post hysterectomy  . Hypertensive heart disease without heart failure   . Memory deficit   . Neuromuscular disorder (HCC)    intermittent facial weakness - but at times its severe enough that "I have gone to the hospital", [place] but [place] in [place]- seen by Dr. Loretta Plume  . Numbness of face   . Other and unspecified hyperlipidemia   . Plantar fasciitis   . Stroke [facility])    TIA  . Unspecified transient cerebral ischemia    takes plavix for 2ndary stroke prevention per Dr. Tomi Likens with neurology    Past Surgical History:  Procedure Laterality Date  . BILATERAL SALPINGOOPHORECTOMY    . BREAST LUMPECTOMY WITH RADIOACTIVE SEED LOCALIZATION Right 08/02/2015   Procedure: BREAST LUMPECTOMY WITH RADIOACTIVE SEED LOCALIZATION;  Surgeon: [name], MD;  Location: [place];  Service: General;  Laterality: Right;  . colonoscopy     removed polyps- to f/u in 6 months  . TOTAL ABDOMINAL HYSTERECTOMY    . TRIGGER FINGER RELEASE Right 10/21/2016   Procedure: RELEASE TRIGGER FINGER/A-1 PULLEY RIGHT RING FINGER;  Surgeon: Daryll Brod, MD;  Location: [place];  Service: Orthopedics;  Laterality: Right;     reports that she has never smoked. She has never used smokeless tobacco. She reports that she does not drink alcohol or use drugs.  Allergies  Allergen Reactions  . Aspirin Other (See Comments)    GI Bleeding- Large amounts cause internal bleeding     Family History  Problem Relation Age of Onset  . Cancer Mother        panceratic    Prior to Admission medications   Medication Sig Start Date End Date Taking? Authorizing Provider  acetaminophen (TYLENOL) 500 MG tablet Take 500 mg by mouth every 6 (six) hours as needed for mild pain or headache.   Yes [provider]  amLODipine (NORVASC) 10 MG tablet Take 10 mg by mouth daily with lunch.    Yes [provider]  atorvastatin (LIPITOR) 10 MG tablet Take 10 mg by  mouth daily after lunch.   Yes [provider]  buPROPion (WELLBUTRIN XL) 300 MG 24 hr tablet Take 300 mg by mouth every morning.   Yes [provider]  Calcium Carb-Cholecalciferol (CALCIUM PLUS VITAMIN D3 PO) Take 1 tablet by mouth 2 (two) times a week.   Yes [provider]  citalopram (CELEXA) 10 MG tablet Take 5 mg by mouth at bedtime.   Yes [provider]  clopidogrel (PLAVIX) 75 MG tablet Take 75 mg by mouth daily.   Yes [provider]  diclofenac Sodium (VOLTAREN) 1 % GEL Apply 2-4 g topically 4 (four) times daily as needed (for bilateral knee pain).    Yes [provider]  estradiol (ESTRACE) 0.1 MG/GM vaginal cream Place 1 Applicatorful vaginally once a week.   Yes [provider]  hydrochlorothiazide (HYDRODIURIL) 25 MG tablet Take 25 mg by mouth daily with lunch.    Yes [provider]  Multiple Vitamin (MULTI VITAMIN PO) Take 1 tablet by mouth daily.   Yes [provider]  polyethylene glycol (MIRALAX / GLYCOLAX) packet Take 17 g by mouth daily as needed for mild constipation.    Yes [provider]  Propylene Glycol (SYSTANE COMPLETE) 0.6 % SOLN Place 1 drop into both eyes at bedtime.   Yes [provider]  vitamin B-12 (CYANOCOBALAMIN) 100 MCG tablet Take 100 mcg by mouth daily.   Yes [provider]  clonazePAM (KLONOPIN) 0.5 MG tablet Take 0.5 mg by mouth daily.    [provider]  omeprazole (PRILOSEC) 20 MG capsule Take 20 mg by mouth daily as needed (heartburn).    [provider]  traMADol (ULTRAM) 50 MG tablet Take 1 tablet (50 mg total) by mouth every 6 (six) hours as needed. Patient not taking: Reported on 05/22/2019 10/21/16   Daryll Brod, MD    Physical Exam: Constitutional: Moderately built and nourished. Vitals:   05/22/19 2000 05/22/19 2009 05/22/19 2056 05/22/19 2100  BP: (!) 161/98  (!) 155/83 (!) 135/91  Pulse: 76 80 74 74  Resp: 16 17 15  13   Temp:      TempSrc:      SpO2: (!) 86% 99% 96% 97%  Weight:       Eyes: Anicteric no pallor. ENMT: No discharge from the ears eyes nose or mouth. Neck: No mass felt.  No neck rigidity. Respiratory: No rhonchi or crepitations. Cardiovascular: S1-S2 heard. Abdomen: Soft nontender bowel sounds present. Musculoskeletal: No edema. Skin: No rash. Neurologic: Alert awake oriented to time place and person.  Moves all extremities 5 x 5.  No facial asymmetry tongue is midline pupils are equal and reactive to light. Psychiatric: Appears normal.  Normal affect.   Labs on Admission: I have personally reviewed following labs and imaging studies  CBC: Recent Labs  Lab 05/22/19 1401 05/22/19 1436  WBC 5.8  --   NEUTROABS 3.5  --   HGB 13.2 15.3*  HCT 41.6 45.0  MCV 93.1  --   PLT 241  --    Basic Metabolic Panel: Recent Labs  Lab 05/22/19 1401 05/22/19 1436  NA 143 142  K 3.7 3.6  CL 106 105  CO2 26  --   GLUCOSE 97 86  BUN 17 19  CREATININE 1.13* 1.10*  CALCIUM 9.5  --    GFR: CrCl cannot be calculated (Unknown ideal weight.). Liver Function Tests: Recent Labs  Lab 05/22/19 1401  AST 24  ALT 21  ALKPHOS 98  BILITOT 0.6  PROT 7.3  ALBUMIN 3.7   No results for input(s): LIPASE, AMYLASE in the last 168 hours. No results for input(s): AMMONIA in the last 168 hours. Coagulation Profile: Recent Labs  Lab 05/22/19 1401  INR 1.0   Cardiac Enzymes: No results for input(s): CKTOTAL, CKMB, CKMBINDEX, TROPONINI in the last 168 hours. BNP (last 3 results) No results for input(s): PROBNP in the last 8760 hours. HbA1C: No results for input(s): HGBA1C in the last 72 hours. CBG: Recent Labs  Lab 05/22/19 1400  GLUCAP 82   Lipid Profile: No results for input(s): CHOL, HDL, LDLCALC, TRIG, CHOLHDL, LDLDIRECT in the last 72 hours. Thyroid Function Tests: No results for input(s): TSH, T4TOTAL, FREET4, T3FREE, THYROIDAB in the last 72 hours. Anemia Panel: No results  for input(s): VITAMINB12, FOLATE, FERRITIN, TIBC, IRON, RETICCTPCT in the last 72 hours. Urine analysis: No results found for: COLORURINE, APPEARANCEUR, LABSPEC, PHURINE, GLUCOSEU, HGBUR, BILIRUBINUR, KETONESUR, PROTEINUR, UROBILINOGEN, NITRITE, LEUKOCYTESUR Sepsis Labs: @LABRCNTIP (procalcitonin:4,lacticidven:4) ) Recent Results (from the past 240 hour(s))  Respiratory Panel by RT PCR (Flu A&B, Covid) - Nasopharyngeal Swab     Status: None   Collection Time: 05/22/19  8:57 PM   Specimen: Nasopharyngeal Swab  Result Value Ref Range Status   SARS Coronavirus 2 by RT PCR NEGATIVE NEGATIVE Final    Comment: (NOTE) SARS-CoV-2 target nucleic acids are NOT DETECTED. The SARS-CoV-2 RNA is generally detectable in upper respiratoy specimens during the acute phase of infection. The lowest concentration of SARS-CoV-2 viral copies this assay can detect is 131 copies/mL. A negative result does not preclude SARS-Cov-2 infection and should not be used as the sole basis for treatment or other patient management decisions. A negative result may occur with  improper specimen collection/handling, submission of specimen other than nasopharyngeal swab, presence of viral mutation(s) within the areas targeted by this assay, and inadequate number of viral copies (<131 copies/mL). A negative result must be combined with clinical observations, patient history, and epidemiological information. The expected result is Negative. Fact Sheet for Patients:  PinkCheek.be Fact Sheet for Healthcare Providers:  GravelBags.it This test is not yet ap proved or cleared by the [place] FDA and  has been authorized for detection and/or diagnosis of SARS-CoV-2 by FDA under an Emergency Use Authorization (EUA). This EUA will remain  in effect (meaning this test can be used) for the duration of the COVID-19 declaration under Section 564(b)(1) of the Act, 21  U.S.C. section 360bbb-3(b)(1), unless the authorization is terminated or revoked sooner.    Influenza A by PCR NEGATIVE NEGATIVE Final   Influenza B by PCR NEGATIVE NEGATIVE Final    Comment: (NOTE) The Xpert Xpress SARS-CoV-2/FLU/RSV assay is intended as an aid in  the diagnosis of influenza from Nasopharyngeal swab specimens and  should not be used  as a sole basis for treatment. Nasal washings and  aspirates are unacceptable for Xpert Xpress SARS-CoV-2/FLU/RSV  testing. Fact Sheet for Patients: PinkCheek.be Fact Sheet for Healthcare Providers: GravelBags.it This test is not yet approved or cleared by the [place] FDA and  has been authorized for detection and/or diagnosis of SARS-CoV-2 by  FDA under an Emergency Use Authorization (EUA). This EUA will remain  in effect (meaning this test can be used) for the duration of the  Covid-19 declaration under Section 564(b)(1) of the Act, 21  U.S.C. section 360bbb-3(b)(1), unless the authorization is  terminated or revoked. Performed at [facility], [place] [address], [place], [place] [address]      Radiological Exams on Admission: MR ANGIO HEAD WO CONTRAST  Result Date: 05/22/2019 CLINICAL DATA:  Neuro deficit, acute, stroke suspected. EXAM: MRI HEAD WITHOUT CONTRAST MRA HEAD WITHOUT CONTRAST TECHNIQUE: Multiplanar, multiecho pulse sequences of the brain and surrounding structures were obtained without intravenous contrast. Angiographic images of the head were obtained using MRA technique without contrast. COMPARISON:  Noncontrast head CT performed earlier the same day 05/22/2019. FINDINGS: MRI HEAD FINDINGS Brain: There is moderate motion degradation of the coronal T2 weighted sequence. Moderate patchy T2/FLAIR hyperintensity within the cerebral white matter is nonspecific, but consistent with chronic small vessel ischemic disease. There are chronic lacunar infarcts within the  bilateral basal ganglia/internal capsules. Nonspecific chronic microhemorrhages within the splenium of the corpus callosum and left basal ganglia. Cerebral volume is normal for age. There is no acute infarct. No evidence of intracranial mass. No chronic intracranial blood products. No extra-axial fluid collection. No midline shift. Vascular: Expected proximal arterial flow voids. Skull and upper cervical spine: No focal marrow lesion. Sinuses/Orbits: Bilateral lens replacements. Mild ethmoid sinus mucosal thickening. No significant mastoid effusion. MRA HEAD FINDINGS Examination limited by motion degradation. Intracranial internal carotid arteries are patent. Moderate atherosclerotic narrowing of the right cavernous segment. Mild-to-moderate narrowing of the distal cavernous/paraclinoid segment on the left. The M1 middle cerebral arteries are patent without significant stenosis. Motion limits evaluation of the M2 and more distal MCA branch vessels. However, there are apparent moderate/severe focal stenoses within superior and inferior division proximal right M2 MCA branches (series 1031, image 8) (series 1031, image 3). Apparent high-grade focal stenosis within a proximal left M2 inferior division branch (series 1051, image 4). 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 Azygos configuration of the A2 and A3 anterior cerebral arteries. No significant proximal stenosis. The intracranial vertebral arteries are patent without significant stenosis, as is the basilar artery. The posterior cerebral arteries are patent bilaterally. High-grade stenosis within the right posterior cerebral artery at the P2-P3 junction (series 1067, image 15). Multiple sites of moderate stenosis within the P2 left posterior cerebral artery. Multifocal moderate to moderately severe stenoses within the P3 and more distal left PCA. A left posterior communicating artery is present. The right posterior communicating artery is  hypoplastic or absent. No intracranial aneurysm is identified. IMPRESSION: MRI brain: 1. No evidence of acute intracranial abnormality, including acute infarction. 2. Chronic lacunar infarcts within the bilateral internal capsules/basal ganglia. Specifically, the age-indeterminate lacunar infarct described on earlier head CT is chronic. 3. Background moderate chronic small vessel ischemic changes within the cerebral white matter. 4. Mild ethmoid sinus mucosal thickening. MRA head: 1. Examination limited by motion degradation. 2. Intracranial atherosclerotic disease with multifocal stenoses, most notably as follows. 3. Moderate atherosclerotic narrowing of the cavernous right ICA. 4. Mild/moderate narrowing of the distal cavernous/paraclinoid left  ICA. 5. There are apparent high-grade stenoses within multiple proximal M2 MCA branch vessels bilaterally. Some of these stenoses may be accentuated by motion artifact. 6. High-grade stenosis within the right posterior cerebral artery at the P2-P3 junction. 7. Multifocal moderate stenoses within the P2 left PCA. Multiple moderate to moderately severe stenoses within the P3 and more distal left PCA. Electronically Signed   By: Kellie Simmering DO   On: 05/22/2019 17:17   MR ANGIO NECK WO CONTRAST  Result Date: 05/22/2019 CLINICAL DATA:  Neuro deficit, acute, stroke suspected. EXAM: MRA NECK WITHOUT CONTRAST TECHNIQUE: Angiographic images of the neck were obtained using MRA technique without intravenous contrast. Carotid stenosis measurements (when applicable) are obtained utilizing NASCET criteria, using the distal internal carotid diameter as the denominator. COMPARISON:  No pertinent prior studies available for comparison. FINDINGS: Examination limited by motion degradation and noncontrast technique. There is standard aortic branching. Motion artifact and noncontrast technique precludes adequate evaluation for stenoses within the distal innominate artery, proximal  subclavian arteries, proximal common carotid arteries and proximal vertebral arteries. The common and internal carotid arteries are patent within the neck bilaterally with antegrade flow. There is no appreciable hemodynamically significant stenosis within the mid or distal common carotid arteries. There is narrowing at the origin of the right ICA which may approach or exceed 70% (series 16, image 50). There is no appreciable significant stenosis of the cervical left ICA (50% or greater). The vertebral arteries are patent within the neck bilaterally with antegrade flow. Probable stenoses at the origin of the right vertebral artery and within the proximal V1 left vertebral artery which are poorly quantified for reasons described. No appreciable significant stenosis more distally within the imaged cervical vertebral arteries. There is a short segment fenestration within the mid V2 left vertebral artery. IMPRESSION: Examination limited by motion degradation and noncontrast technique. This precludes adequate evaluation for stenoses within the distal innominate artery, proximal subclavian arteries, proximal common carotid arteries and proximal vertebral arteries. The common and internal carotid arteries are patent within the neck with antegrade flow. There is narrowing at the origin of the right ICA which may approach or exceed 70%. Carotid artery duplex is recommended for further evaluation. No hemodynamically significant stenosis of the cervical left ICA. The vertebral arteries are patent within the neck bilaterally with antegrade flow. Probable stenoses at the origin of the right vertebral artery and within the proximal V1 left vertebral artery which are poorly quantified for reasons described. No appreciable significant stenosis more distally within the imaged cervical vertebral arteries. Electronically Signed   By: Kellie Simmering DO   On: 05/22/2019 17:36   MR BRAIN WO CONTRAST  Result Date: 05/22/2019 CLINICAL  DATA:  Neuro deficit, acute, stroke suspected. EXAM: MRI HEAD WITHOUT CONTRAST MRA HEAD WITHOUT CONTRAST TECHNIQUE: Multiplanar, multiecho pulse sequences of the brain and surrounding structures were obtained without intravenous contrast. Angiographic images of the head were obtained using MRA technique without contrast. COMPARISON:  Noncontrast head CT performed earlier the same day 05/22/2019. FINDINGS: MRI HEAD FINDINGS Brain: There is moderate motion degradation of the coronal T2 weighted sequence. Moderate patchy T2/FLAIR hyperintensity within the cerebral white matter is nonspecific, but consistent with chronic small vessel ischemic disease. There are chronic lacunar infarcts within the bilateral basal ganglia/internal capsules. Nonspecific chronic microhemorrhages within the splenium of the corpus callosum and left basal ganglia. Cerebral volume is normal for age. There is no acute infarct. No evidence of intracranial mass. No chronic intracranial blood products. No extra-axial fluid  collection. No midline shift. Vascular: Expected proximal arterial flow voids. Skull and upper cervical spine: No focal marrow lesion. Sinuses/Orbits: Bilateral lens replacements. Mild ethmoid sinus mucosal thickening. No significant mastoid effusion. MRA HEAD FINDINGS Examination limited by motion degradation. Intracranial internal carotid arteries are patent. Moderate atherosclerotic narrowing of the right cavernous segment. Mild-to-moderate narrowing of the distal cavernous/paraclinoid segment on the left. The M1 middle cerebral arteries are patent without significant stenosis. Motion limits evaluation of the M2 and more distal MCA branch vessels. However, there are apparent moderate/severe focal stenoses within superior and inferior division proximal right M2 MCA branches (series 1031, image 8) (series 1031, image 3). Apparent high-grade focal stenosis within a proximal left M2 inferior division branch (series 1051, image 4).  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 Azygos configuration of the A2 and A3 anterior cerebral arteries. No significant proximal stenosis. The intracranial vertebral arteries are patent without significant stenosis, as is the basilar artery. The posterior cerebral arteries are patent bilaterally. High-grade stenosis within the right posterior cerebral artery at the P2-P3 junction (series 1067, image 15). Multiple sites of moderate stenosis within the P2 left posterior cerebral artery. Multifocal moderate to moderately severe stenoses within the P3 and more distal left PCA. A left posterior communicating artery is present. The right posterior communicating artery is hypoplastic or absent. No intracranial aneurysm is identified. IMPRESSION: MRI brain: 1. No evidence of acute intracranial abnormality, including acute infarction. 2. Chronic lacunar infarcts within the bilateral internal capsules/basal ganglia. Specifically, the age-indeterminate lacunar infarct described on earlier head CT is chronic. 3. Background moderate chronic small vessel ischemic changes within the cerebral white matter. 4. Mild ethmoid sinus mucosal thickening. MRA head: 1. Examination limited by motion degradation. 2. Intracranial atherosclerotic disease with multifocal stenoses, most notably as follows. 3. Moderate atherosclerotic narrowing of the cavernous right ICA. 4. Mild/moderate narrowing of the distal cavernous/paraclinoid left ICA. 5. There are apparent high-grade stenoses within multiple proximal M2 MCA branch vessels bilaterally. Some of these stenoses may be accentuated by motion artifact. 6. High-grade stenosis within the right posterior cerebral artery at the P2-P3 junction. 7. Multifocal moderate stenoses within the P2 left PCA. Multiple moderate to moderately severe stenoses within the P3 and more distal left PCA. Electronically Signed   By: Kellie Simmering DO   On: 05/22/2019 17:17   CT HEAD CODE STROKE  WO CONTRAST  Result Date: 05/22/2019 CLINICAL DATA:  Code stroke. Possible stroke; neuro deficit, acute, stroke suspected. Right-sided weakness. EXAM: CT HEAD WITHOUT CONTRAST TECHNIQUE: Contiguous axial images were obtained from the base of the skull through the vertex without intravenous contrast. COMPARISON:  No pertinent prior studies available for comparison. FINDINGS: Brain: There is a 10 mm age-indeterminate lacunar infarct within the left basal ganglia/internal capsule (series 3, image 13) (series 5, image 29). Immediately adjacent there is a chronic appearing lacunar infarct within the posterior limb of left internal capsule (series 3, image 13). Background mild ill-defined hypoattenuation within the cerebral white matter is nonspecific, but consistent with chronic small vessel ischemic disease. Cerebral volume is normal. There is no acute intracranial hemorrhage. No demarcated cortical infarct. No extra-axial fluid collection. No evidence of intracranial mass. No midline shift. Vascular: No hyperdense vessel.  Atherosclerotic calcifications. Skull: Normal. Negative for fracture or focal lesion. Sinuses/Orbits: Visualized orbits show no acute finding. Mild ethmoid sinus mucosal thickening. No significant mastoid effusion. These results were communicated to Dr. Cheral Marker At 2:16 pmon 5/9/2021by text page via the [facility] messaging system. IMPRESSION: 1.  10 mm age-indeterminate left internal capsule/basal ganglia lacunar infarct. This may be acute. Consider brain MRI for further evaluation. 2. No acute intracranial hemorrhage or visible cortical infarct. 3. Small chronic lacunar infarct within the posterior limb of left internal capsule. Mild background chronic small vessel ischemic changes within the cerebral white matter. 4. Mild ethmoid sinus mucosal thickening. Electronically Signed   By: Kellie Simmering DO   On: 05/22/2019 14:17    EKG: Independently reviewed.  Normal sinus  rhythm.  Assessment/Plan Principal Problem:   TIA (transient ischemic attack) Active Problems:   Essential hypertension    1. TIA -appreciate neurology consult discussed with Dr. Demetra Shiner.  Patient is on Plavix statins which will be continued.  Neurochecks.  Patient passed swallow.  2D echo is pending.  Check hemoglobin A1c and lipid panel. 2. Headache cause not clear.  Check sed rate and CRP.  Further recommendations per neurology. 3. Hypertension we will overwhelm the same hypertension for now as needed IV hydralazine for systolic blood pressure more than [id number] and diastolic more than [id number]. 4. Hyperlipidemia on statins. 5. Depression on bupropion and Celexa.   DVT prophylaxis: Lovenox. Code Status: Full code. Family Communication: Discussed with patient. Disposition Plan: Home. Consults called: Neurology. Admission status: Observation.   Rise Patience MD [facility] Pager [phone].  If 7PM-7AM, please contact night-coverage [email or web address] Password [email or web address]  05/22/2019, 10:20 PM

## 2019-05-22 NOTE — ED Notes (Signed)
Pt given a sandwich, and a hot water at pt's request. No other needs at this time.

## 2019-05-22 NOTE — ED Provider Notes (Signed)
[facility] EMERGENCY DEPARTMENT Provider Note   CSN: [id number] Arrival date & time: 05/22/19  1352     History Chief Complaint  Patient presents with  . Code Stroke    Christina Austin is a 85 y.o. female.  Patient is a 85 year old female who presents as a code stroke.  She states that 2 days ago she had an intense headache to the right side of her head.  She feels like it started rather suddenly but cannot say any that it was a thunderclap type headache.  She said it was very intense in the right side of her head and was radiating down to her right anterior/lateral neck.  She tried to massage it but it did not go away.  It eased off after about an hour or so.  It did persist but was much better.  Today he she had a recurrence of intensity of the right side headache with radiation down her right neck and weakness in her right arm.  She noticed some numbness in her arm.  She said her right leg felt different but she describes it more of is an achiness rather than a weakness or numbness.  She has had headaches like this in the past which she attributes to possible TIAs although she cannot verify that.  She has not had the pain that radiates down to her neck before.  She denies any vision changes or speech deficits.  No trouble with her balance.  No other recent illnesses.  No chest pain or shortness of breath.  No back pain.        Past Medical History:  Diagnosis Date  . Anxiety   . Arthritis    legs, shoulders   . Benign essential HTN   . Chronic kidney disease, stage III (moderate)    chronic kidney disease stage 3  . Depression   . GERD (gastroesophageal reflux disease)   . History of blood transfusion    post hysterectomy  . Hypertensive heart disease without heart failure   . Memory deficit   . Neuromuscular disorder (HCC)    intermittent facial weakness - but at times its severe enough that "I have gone to the hospital", [place] but [place] in [place]- seen  by Dr. Loretta Plume  . Numbness of face   . Other and unspecified hyperlipidemia   . Plantar fasciitis   . Stroke [facility])    TIA  . Unspecified transient cerebral ischemia    takes plavix for 2ndary stroke prevention per Dr. Tomi Likens with neurology    Patient Active Problem List   Diagnosis Date Noted  . Hyperlipidemia 03/31/2016  . Hypertensive heart disease 03/31/2016  . Personal history of transient ischemic attack (TIA), and cerebral infarction without residual deficits 03/31/2016    Past Surgical History:  Procedure Laterality Date  . BILATERAL SALPINGOOPHORECTOMY    . BREAST LUMPECTOMY WITH RADIOACTIVE SEED LOCALIZATION Right 08/02/2015   Procedure: BREAST LUMPECTOMY WITH RADIOACTIVE SEED LOCALIZATION;  Surgeon: [name], MD;  Location: [facility];  Service: General;  Laterality: Right;  . colonoscopy     removed polyps- to f/u in 6 months  . TOTAL ABDOMINAL HYSTERECTOMY    . TRIGGER FINGER RELEASE Right 10/21/2016   Procedure: RELEASE TRIGGER FINGER/A-1 PULLEY RIGHT RING FINGER;  Surgeon: Daryll Brod, MD;  Location: [facility];  Service: Orthopedics;  Laterality: Right;     OB History   No obstetric history on file.     Family  History  Problem Relation Age of Onset  . Cancer Mother        panceratic    Social History   Tobacco Use  . Smoking status: Never Smoker  . Smokeless tobacco: Never Used  Substance Use Topics  . Alcohol use: No  . Drug use: No    Home Medications Prior to Admission medications   Medication Sig Start Date End Date Taking? Authorizing Provider  amLODipine (NORVASC) 10 MG tablet Take 10 mg by mouth every evening.     [provider]  atorvastatin (LIPITOR) 40 MG tablet Take 20 mg by mouth every evening.    [provider]  buPROPion (WELLBUTRIN XL) 300 MG 24 hr tablet Take 300 mg by mouth every morning.    [provider]  clonazePAM (KLONOPIN) 0.5 MG tablet Take 0.5 mg by mouth daily.    [provider]  clopidogrel (PLAVIX) 75 MG tablet Take 75 mg by mouth daily.    [provider]  hydrochlorothiazide (HYDRODIURIL) 25 MG tablet Take 25 mg by mouth every evening.     [provider]  mirabegron ER (MYRBETRIQ) 25 MG TB24 tablet Take 25 mg by mouth daily.    [provider]  Multiple Vitamin (MULTI VITAMIN PO) Take 1 tablet by mouth daily.    [provider]  omeprazole (PRILOSEC) 20 MG capsule Take 20 mg by mouth daily as needed (heartburn).    [provider]  polyethylene glycol (MIRALAX / GLYCOLAX) packet Take 17 g by mouth daily.    [provider]  traMADol (ULTRAM) 50 MG tablet Take 1 tablet (50 mg total) by mouth every 6 (six) hours as needed. 10/21/16   Daryll Brod, MD  vitamin B-12 (CYANOCOBALAMIN) 100 MCG tablet Take 100 mcg by mouth daily.    [provider]    Allergies    Aspirin  Review of Systems   Review of Systems  Constitutional: Negative for chills, diaphoresis, fatigue and fever.  HENT: Negative for congestion, rhinorrhea and sneezing.   Eyes: Negative.   Respiratory: Negative for cough, chest tightness and shortness of breath.   Cardiovascular: Negative for chest pain and leg swelling.  Gastrointestinal: Negative for abdominal pain, blood in stool, diarrhea, nausea and vomiting.  Genitourinary: Negative for difficulty urinating, flank pain, frequency and hematuria.  Musculoskeletal: Negative for arthralgias and back pain.  Skin: Negative for rash.  Neurological: Positive for weakness, numbness and headaches. Negative for dizziness and speech difficulty.    Physical Exam Updated Vital Signs BP (!) 158/83   Pulse 74   Temp 98.4 F (36.9 C) (Oral)   Resp 16   Wt 64.7 kg   SpO2 100%   BMI 25.27 kg/m   Physical Exam Constitutional:      Appearance: She is well-developed.  HENT:     Head: Normocephalic and atraumatic.  Eyes:     Pupils: Pupils are equal, round, and reactive to  light.  Cardiovascular:     Rate and Rhythm: Normal rate and regular rhythm.     Heart sounds: Normal heart sounds.  Pulmonary:     Effort: Pulmonary effort is normal. No respiratory distress.     Breath sounds: Normal breath sounds. No wheezing or rales.  Chest:     Chest wall: No tenderness.  Abdominal:     General: Bowel sounds are normal.     Palpations: Abdomen is soft.     Tenderness: There is no abdominal tenderness. There is no guarding  or rebound.  Musculoskeletal:        General: Normal range of motion.     Cervical back: Normal range of motion and neck supple.  Lymphadenopathy:     Cervical: No cervical adenopathy.  Skin:    General: Skin is warm and dry.     Findings: No rash.  Neurological:     Mental Status: She is alert and oriented to person, place, and time.     Comments: Motor 5/5 all extremities Sensation grossly intact to LT all extremities Finger to Nose intact, no pronator drift CN II-XII grossly intact       ED Results / Procedures / Treatments   Labs (all labs ordered are listed, but only abnormal results are displayed) Labs Reviewed  I-STAT CHEM 8, ED - Abnormal; Notable for the following components:      Result Value   Creatinine, Ser 1.10 (*)    Hemoglobin 15.3 (*)    All other components within normal limits  PROTIME-INR  APTT  CBC  DIFFERENTIAL  COMPREHENSIVE METABOLIC PANEL  CBG MONITORING, ED    EKG None  Radiology CT HEAD CODE STROKE WO CONTRAST  Result Date: 05/22/2019 CLINICAL DATA:  Code stroke. Possible stroke; neuro deficit, acute, stroke suspected. Right-sided weakness. EXAM: CT HEAD WITHOUT CONTRAST TECHNIQUE: Contiguous axial images were obtained from the base of the skull through the vertex without intravenous contrast. COMPARISON:  No pertinent prior studies available for comparison. FINDINGS: Brain: There is a 10 mm age-indeterminate lacunar infarct within the left basal ganglia/internal capsule (series 3, image 13)  (series 5, image 29). Immediately adjacent there is a chronic appearing lacunar infarct within the posterior limb of left internal capsule (series 3, image 13). Background mild ill-defined hypoattenuation within the cerebral white matter is nonspecific, but consistent with chronic small vessel ischemic disease. Cerebral volume is normal. There is no acute intracranial hemorrhage. No demarcated cortical infarct. No extra-axial fluid collection. No evidence of intracranial mass. No midline shift. Vascular: No hyperdense vessel.  Atherosclerotic calcifications. Skull: Normal. Negative for fracture or focal lesion. Sinuses/Orbits: Visualized orbits show no acute finding. Mild ethmoid sinus mucosal thickening. No significant mastoid effusion. These results were communicated to Dr. Cheral Marker At 2:16 pmon 5/9/2021by text page via the [facility] messaging system. IMPRESSION: 1. 10 mm age-indeterminate left internal capsule/basal ganglia lacunar infarct. This may be acute. Consider brain MRI for further evaluation. 2. No acute intracranial hemorrhage or visible cortical infarct. 3. Small chronic lacunar infarct within the posterior limb of left internal capsule. Mild background chronic small vessel ischemic changes within the cerebral white matter. 4. Mild ethmoid sinus mucosal thickening. Electronically Signed   By: Kellie Simmering DO   On: 05/22/2019 14:17    Procedures Procedures (including critical care time)  Medications Ordered in ED Medications  sodium chloride flush (NS) 0.9 % injection 3 mL (has no administration in time range)  promethazine (PHENERGAN) injection 12.5 mg (has no administration in time range)    ED Course  I have reviewed the triage vital signs and the nursing notes.  Pertinent labs & imaging results that were available during my care of the patient were reviewed by me and considered in my medical decision making (see chart for details).    MDM Rules/Calculators/A&P                      Patient is a 85 year old female who presents with right-sided headache associated with some right-sided arm weakness.  She has subjective  weakness but nothing that is appreciated on exam.  CT scan shows a possible acute/subacute stroke.  Dr. Cheral Marker with neurology has seen the patient.  MRIs have been ordered as well as MRAs of the head and neck.  This would assess for evidence of acute stroke as well as possible aneurysm or dissection.  Labs are reviewed and are nonconcerning.  Patient was given medicine for possible migraine.  Care turned over to Dr. Maryan Rued pending MRIs. Final Clinical Impression(s) / ED Diagnoses Final diagnoses:  None    Rx / DC Orders ED Discharge Orders    None       Malvin Johns, MD 05/22/19 1447

## 2019-05-23 ENCOUNTER — Encounter: Payer: Self-pay | Admitting: Neurology

## 2019-05-23 ENCOUNTER — Other Ambulatory Visit: Payer: Self-pay

## 2019-05-23 ENCOUNTER — Observation Stay (HOSPITAL_BASED_OUTPATIENT_CLINIC_OR_DEPARTMENT_OTHER): Payer: Medicare Other

## 2019-05-23 DIAGNOSIS — I639 Cerebral infarction, unspecified: Secondary | ICD-10-CM

## 2019-05-23 DIAGNOSIS — I361 Nonrheumatic tricuspid (valve) insufficiency: Secondary | ICD-10-CM

## 2019-05-23 DIAGNOSIS — G459 Transient cerebral ischemic attack, unspecified: Secondary | ICD-10-CM

## 2019-05-23 DIAGNOSIS — I10 Essential (primary) hypertension: Secondary | ICD-10-CM | POA: Diagnosis not present

## 2019-05-23 LAB — HEMOGLOBIN A1C
Hgb A1c MFr Bld: 6.2 % — ABNORMAL HIGH (ref 4.8–5.6)
Mean Plasma Glucose: 131.24 mg/dL

## 2019-05-23 LAB — CBC WITH DIFFERENTIAL/PLATELET
Abs Immature Granulocytes: 0.01 10*3/uL (ref 0.00–0.07)
Basophils Absolute: 0 10*3/uL (ref 0.0–0.1)
Basophils Relative: 1 %
Eosinophils Absolute: 0.1 10*3/uL (ref 0.0–0.5)
Eosinophils Relative: 2 %
HCT: 42.9 % (ref 36.0–46.0)
Hemoglobin: 13.5 g/dL (ref 12.0–15.0)
Immature Granulocytes: 0 %
Lymphocytes Relative: 32 %
Lymphs Abs: 1.8 10*3/uL (ref 0.7–4.0)
MCH: 28.5 pg (ref 26.0–34.0)
MCHC: 31.5 g/dL (ref 30.0–36.0)
MCV: 90.7 fL (ref 80.0–100.0)
Monocytes Absolute: 0.5 10*3/uL (ref 0.1–1.0)
Monocytes Relative: 9 %
Neutro Abs: 3.1 10*3/uL (ref 1.7–7.7)
Neutrophils Relative %: 56 %
Platelets: 237 10*3/uL (ref 150–400)
RBC: 4.73 MIL/uL (ref 3.87–5.11)
RDW: 13 % (ref 11.5–15.5)
WBC: 5.6 10*3/uL (ref 4.0–10.5)
nRBC: 0 % (ref 0.0–0.2)

## 2019-05-23 LAB — C-REACTIVE PROTEIN: CRP: 0.5 mg/dL (ref <1.0)

## 2019-05-23 LAB — LIPID PANEL
Cholesterol: 158 mg/dL (ref 0–200)
HDL: 53 mg/dL (ref >40)
LDL Cholesterol: 92 mg/dL (ref 0–99)
Total CHOL/HDL Ratio: 3 RATIO
Triglycerides: 67 mg/dL (ref <150)
VLDL: 13 mg/dL (ref 0–40)

## 2019-05-23 LAB — COMPREHENSIVE METABOLIC PANEL
ALT: 19 U/L (ref 0–44)
AST: 24 U/L (ref 15–41)
Albumin: 3.5 g/dL (ref 3.5–5.0)
Alkaline Phosphatase: 86 U/L (ref 38–126)
Anion gap: 9 (ref 5–15)
BUN: 12 mg/dL (ref 8–23)
CO2: 26 mmol/L (ref 22–32)
Calcium: 9 mg/dL (ref 8.9–10.3)
Chloride: 107 mmol/L (ref 98–111)
Creatinine, Ser: 1.04 mg/dL — ABNORMAL HIGH (ref 0.44–1.00)
GFR calc Af Amer: 58 mL/min — ABNORMAL LOW (ref >60)
GFR calc non Af Amer: 50 mL/min — ABNORMAL LOW (ref >60)
Glucose, Bld: 83 mg/dL (ref 70–99)
Potassium: 3.3 mmol/L — ABNORMAL LOW (ref 3.5–5.1)
Sodium: 142 mmol/L (ref 135–145)
Total Bilirubin: 0.8 mg/dL (ref 0.3–1.2)
Total Protein: 7.2 g/dL (ref 6.5–8.1)

## 2019-05-23 LAB — SEDIMENTATION RATE: Sed Rate: 18 mm/hr (ref 0–22)

## 2019-05-23 LAB — ECHOCARDIOGRAM COMPLETE
Height: 62 in
Weight: 2232.82 oz

## 2019-05-23 MED ORDER — POTASSIUM CHLORIDE CRYS ER 20 MEQ PO TBCR
40.0000 meq | EXTENDED_RELEASE_TABLET | Freq: Once | ORAL | Status: AC
  Administered 2019-05-23: 40 meq via ORAL
  Filled 2019-05-23: qty 2

## 2019-05-23 MED ORDER — ATORVASTATIN CALCIUM 10 MG PO TABS: 20.0000 mg | ORAL_TABLET | Freq: Every day | ORAL | Status: DC

## 2019-05-23 MED ORDER — ATORVASTATIN CALCIUM 20 MG PO TABS: 20.0000 mg | ORAL_TABLET | Freq: Every day | ORAL | 3 refills | Status: AC

## 2019-05-23 NOTE — Discharge Summary (Signed)
Physician Discharge Summary  Christina Austin CBS:[id number] DOB: [birth year] DOA: 05/22/2019  PCP: Lucianne Lei, MD  Admit date: 05/22/2019 Discharge date: 05/23/2019  Admitted From: Home  Disposition: Home   Recommendations for Outpatient Follow-up:  1. Follow up with PCP in 1-2 weeks 2. Please obtain BMP/CBC in one week 3. Needs follow up for orthostatic vital.  4. Follow up with neurology in couples of week.  5. Continue with risk factors modifications.   Home Health: yes, HH PT>  Discharge Condition: Stable.  CODE STATUS: Full code Diet recommendation: Heart Healthy  Brief/Interim Summary: Christina Austin is a 85 y.o. female with history of hypertension, depression, hyperlipidemia presents to the ER after patient had worsening headache and some weakness of the right upper and lower extremity.  Patient states her symptoms started on May 20, 2019 about 3 days ago when patient started having severe headache in the right side of scalp with involving the face and neck.  This lasted for few hours and resolved without any intervention.  At that time patient did not have any visual symptoms or weakness.  Then this afternoon around 12 PM patient's headache again started recurring.  This time patient also felt that she may have had some soreness or weakness of the right upper and lower extremity.  Lasted almost an hour at this point patient called EMS and was brought to the ER.  Did not have any difficulty with swallowing speaking or any visual symptoms.  No symptoms on the left side.  Headache was dull persistent involving the right side of the face and scalp and neck.  Not as intense as the one happened 2 days ago.  Did not have any fever chills.  ED Course: In the ER patient was appearing nonfocal.  CT head MRI brain MRA brain and neck did not show any large vessel obstruction or acute stroke.  Given the symptoms neurology at this time who was consulted requested admission for TIA work-up.  Covid test  was negative.  EKG shows normal sinus rhythm.  Labs show creatinine 1.1 CBC was unremarkable.  1-TIA;  Presents with headaches and Right Upper extremity weakness, resolved.  MRI negative for Stroke. MRA; negative for large vessel diseases. MRA neck; Right ICA stenosis. Doppler right ICA only 1-39 %.  Discussed with neurology continue with Plavix. Patient prior history of GI bleed with aspirin.  Lipitor increase at 20 mg daily.  Resume Norvasc.  ESR negative.  Discharge today, awaiting ECHO results.   2-HTN, resume only Norvasc. Stop HCTZ, Orthostatic mildly positive from lying 149  Decrease to 132 standing. Patient report mild lightheaded for long time. education provided to patient. Also advised her to use stocking compression and keep herself hydrated.   3-HLD; continue with statins.   Discharge Diagnoses:  Principal Problem:   TIA (transient ischemic attack) Active Problems:   Essential hypertension    Discharge Instructions  Discharge Instructions    Ambulatory referral to Neurology   Complete by: As directed    An appointment is requested in approximately: 4 weeks In hospital for similar sx. You have seen in the past.   Diet - low sodium heart healthy   Complete by: As directed    Increase activity slowly   Complete by: As directed      Allergies as of 05/23/2019      Reactions   Aspirin Other (See Comments)   GI Bleeding- Large amounts cause internal bleeding      Medication List  STOP taking these medications   clonazePAM 0.5 MG tablet Commonly known as: KLONOPIN   estradiol 0.1 MG/GM vaginal cream Commonly known as: ESTRACE   hydrochlorothiazide 25 MG tablet Commonly known as: HYDRODIURIL   traMADol 50 MG tablet Commonly known as: Ultram     TAKE these medications   acetaminophen 500 MG tablet Commonly known as: TYLENOL Take 500 mg by mouth every 6 (six) hours as needed for mild pain or headache.   amLODipine 10 MG tablet Commonly known as:  NORVASC Take 10 mg by mouth daily with lunch.   atorvastatin 20 MG tablet Commonly known as: LIPITOR Take 1 tablet (20 mg total) by mouth daily. Start taking on: May 24, 2019 What changed:   medication strength  how much to take  when to take this   buPROPion 300 MG 24 hr tablet Commonly known as: WELLBUTRIN XL Take 300 mg by mouth every morning.   CALCIUM PLUS VITAMIN D3 PO Take 1 tablet by mouth 2 (two) times a week.   citalopram 10 MG tablet Commonly known as: CELEXA Take 5 mg by mouth at bedtime.   clopidogrel 75 MG tablet Commonly known as: PLAVIX Take 75 mg by mouth daily.   MULTI VITAMIN PO Take 1 tablet by mouth daily.   omeprazole 20 MG capsule Commonly known as: PRILOSEC Take 20 mg by mouth daily as needed (heartburn).   polyethylene glycol 17 g packet Commonly known as: MIRALAX / GLYCOLAX Take 17 g by mouth daily as needed for mild constipation.   Systane Complete 0.6 % Soln Generic drug: Propylene Glycol Place 1 drop into both eyes at bedtime.   vitamin B-12 100 MCG tablet Commonly known as: CYANOCOBALAMIN Take 100 mcg by mouth daily.   Voltaren 1 % Gel Generic drug: diclofenac Sodium Apply 2-4 g topically 4 (four) times daily as needed (for bilateral knee pain).      Follow-up Information    Pieter Partridge, DO. Schedule an appointment as soon as possible for a visit in 4 week(s).   Specialty: Neurology Why: TIA follow up. you have seen him in the past. Contact information: [address] [place] [address] [phone]        Lucianne Lei, MD Follow up in 1 week(s).   Specialty: Family Medicine Why: for BP follow up.  evelaute for orthostatic hypotension.  Contact information: [address] [place] [address] [phone]          Allergies  Allergen Reactions  . Aspirin Other (See Comments)    GI Bleeding- Large amounts cause internal bleeding     Consultations:  Dr  Leonie Man   Procedures/Studies: MR ANGIO HEAD WO CONTRAST  Result Date: 05/22/2019 CLINICAL DATA:  Neuro deficit, acute, stroke suspected. EXAM: MRI HEAD WITHOUT CONTRAST MRA HEAD WITHOUT CONTRAST TECHNIQUE: Multiplanar, multiecho pulse sequences of the brain and surrounding structures were obtained without intravenous contrast. Angiographic images of the head were obtained using MRA technique without contrast. COMPARISON:  Noncontrast head CT performed earlier the same day 05/22/2019. FINDINGS: MRI HEAD FINDINGS Brain: There is moderate motion degradation of the coronal T2 weighted sequence. Moderate patchy T2/FLAIR hyperintensity within the cerebral white matter is nonspecific, but consistent with chronic small vessel ischemic disease. There are chronic lacunar infarcts within the bilateral basal ganglia/internal capsules. Nonspecific chronic microhemorrhages within the splenium of the corpus callosum and left basal ganglia. Cerebral volume is normal for age. There is no acute infarct. No evidence of  intracranial mass. No chronic intracranial blood products. No extra-axial fluid collection. No midline shift. Vascular: Expected proximal arterial flow voids. Skull and upper cervical spine: No focal marrow lesion. Sinuses/Orbits: Bilateral lens replacements. Mild ethmoid sinus mucosal thickening. No significant mastoid effusion. MRA HEAD FINDINGS Examination limited by motion degradation. Intracranial internal carotid arteries are patent. Moderate atherosclerotic narrowing of the right cavernous segment. Mild-to-moderate narrowing of the distal cavernous/paraclinoid segment on the left. The M1 middle cerebral arteries are patent without significant stenosis. Motion limits evaluation of the M2 and more distal MCA branch vessels. However, there are apparent moderate/severe focal stenoses within superior and inferior division proximal right M2 MCA branches (series 1031, image 8) (series 1031, image 3). Apparent  high-grade focal stenosis within a proximal left M2 inferior division branch (series 1051, image 4). 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 Azygos configuration of the A2 and A3 anterior cerebral arteries. No significant proximal stenosis. The intracranial vertebral arteries are patent without significant stenosis, as is the basilar artery. The posterior cerebral arteries are patent bilaterally. High-grade stenosis within the right posterior cerebral artery at the P2-P3 junction (series 1067, image 15). Multiple sites of moderate stenosis within the P2 left posterior cerebral artery. Multifocal moderate to moderately severe stenoses within the P3 and more distal left PCA. A left posterior communicating artery is present. The right posterior communicating artery is hypoplastic or absent. No intracranial aneurysm is identified. IMPRESSION: MRI brain: 1. No evidence of acute intracranial abnormality, including acute infarction. 2. Chronic lacunar infarcts within the bilateral internal capsules/basal ganglia. Specifically, the age-indeterminate lacunar infarct described on earlier head CT is chronic. 3. Background moderate chronic small vessel ischemic changes within the cerebral white matter. 4. Mild ethmoid sinus mucosal thickening. MRA head: 1. Examination limited by motion degradation. 2. Intracranial atherosclerotic disease with multifocal stenoses, most notably as follows. 3. Moderate atherosclerotic narrowing of the cavernous right ICA. 4. Mild/moderate narrowing of the distal cavernous/paraclinoid left ICA. 5. There are apparent high-grade stenoses within multiple proximal M2 MCA branch vessels bilaterally. Some of these stenoses may be accentuated by motion artifact. 6. High-grade stenosis within the right posterior cerebral artery at the P2-P3 junction. 7. Multifocal moderate stenoses within the P2 left PCA. Multiple moderate to moderately severe stenoses within the P3 and more distal  left PCA. Electronically Signed   By: Kellie Simmering DO   On: 05/22/2019 17:17   MR ANGIO NECK WO CONTRAST  Result Date: 05/22/2019 CLINICAL DATA:  Neuro deficit, acute, stroke suspected. EXAM: MRA NECK WITHOUT CONTRAST TECHNIQUE: Angiographic images of the neck were obtained using MRA technique without intravenous contrast. Carotid stenosis measurements (when applicable) are obtained utilizing NASCET criteria, using the distal internal carotid diameter as the denominator. COMPARISON:  No pertinent prior studies available for comparison. FINDINGS: Examination limited by motion degradation and noncontrast technique. There is standard aortic branching. Motion artifact and noncontrast technique precludes adequate evaluation for stenoses within the distal innominate artery, proximal subclavian arteries, proximal common carotid arteries and proximal vertebral arteries. The common and internal carotid arteries are patent within the neck bilaterally with antegrade flow. There is no appreciable hemodynamically significant stenosis within the mid or distal common carotid arteries. There is narrowing at the origin of the right ICA which may approach or exceed 70% (series 16, image 50). There is no appreciable significant stenosis of the cervical left ICA (50% or greater). The vertebral arteries are patent within the neck bilaterally with antegrade flow. Probable stenoses at the origin of the  right vertebral artery and within the proximal V1 left vertebral artery which are poorly quantified for reasons described. No appreciable significant stenosis more distally within the imaged cervical vertebral arteries. There is a short segment fenestration within the mid V2 left vertebral artery. IMPRESSION: Examination limited by motion degradation and noncontrast technique. This precludes adequate evaluation for stenoses within the distal innominate artery, proximal subclavian arteries, proximal common carotid arteries and proximal  vertebral arteries. The common and internal carotid arteries are patent within the neck with antegrade flow. There is narrowing at the origin of the right ICA which may approach or exceed 70%. Carotid artery duplex is recommended for further evaluation. No hemodynamically significant stenosis of the cervical left ICA. The vertebral arteries are patent within the neck bilaterally with antegrade flow. Probable stenoses at the origin of the right vertebral artery and within the proximal V1 left vertebral artery which are poorly quantified for reasons described. No appreciable significant stenosis more distally within the imaged cervical vertebral arteries. Electronically Signed   By: Kellie Simmering DO   On: 05/22/2019 17:36   MR BRAIN WO CONTRAST  Result Date: 05/22/2019 CLINICAL DATA:  Neuro deficit, acute, stroke suspected. EXAM: MRI HEAD WITHOUT CONTRAST MRA HEAD WITHOUT CONTRAST TECHNIQUE: Multiplanar, multiecho pulse sequences of the brain and surrounding structures were obtained without intravenous contrast. Angiographic images of the head were obtained using MRA technique without contrast. COMPARISON:  Noncontrast head CT performed earlier the same day 05/22/2019. FINDINGS: MRI HEAD FINDINGS Brain: There is moderate motion degradation of the coronal T2 weighted sequence. Moderate patchy T2/FLAIR hyperintensity within the cerebral white matter is nonspecific, but consistent with chronic small vessel ischemic disease. There are chronic lacunar infarcts within the bilateral basal ganglia/internal capsules. Nonspecific chronic microhemorrhages within the splenium of the corpus callosum and left basal ganglia. Cerebral volume is normal for age. There is no acute infarct. No evidence of intracranial mass. No chronic intracranial blood products. No extra-axial fluid collection. No midline shift. Vascular: Expected proximal arterial flow voids. Skull and upper cervical spine: No focal marrow lesion. Sinuses/Orbits:  Bilateral lens replacements. Mild ethmoid sinus mucosal thickening. No significant mastoid effusion. MRA HEAD FINDINGS Examination limited by motion degradation. Intracranial internal carotid arteries are patent. Moderate atherosclerotic narrowing of the right cavernous segment. Mild-to-moderate narrowing of the distal cavernous/paraclinoid segment on the left. The M1 middle cerebral arteries are patent without significant stenosis. Motion limits evaluation of the M2 and more distal MCA branch vessels. However, there are apparent moderate/severe focal stenoses within superior and inferior division proximal right M2 MCA branches (series 1031, image 8) (series 1031, image 3). Apparent high-grade focal stenosis within a proximal left M2 inferior division branch (series 1051, image 4). 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 Azygos configuration of the A2 and A3 anterior cerebral arteries. No significant proximal stenosis. The intracranial vertebral arteries are patent without significant stenosis, as is the basilar artery. The posterior cerebral arteries are patent bilaterally. High-grade stenosis within the right posterior cerebral artery at the P2-P3 junction (series 1067, image 15). Multiple sites of moderate stenosis within the P2 left posterior cerebral artery. Multifocal moderate to moderately severe stenoses within the P3 and more distal left PCA. A left posterior communicating artery is present. The right posterior communicating artery is hypoplastic or absent. No intracranial aneurysm is identified. IMPRESSION: MRI brain: 1. No evidence of acute intracranial abnormality, including acute infarction. 2. Chronic lacunar infarcts within the bilateral internal capsules/basal ganglia. Specifically, the age-indeterminate lacunar infarct described on  earlier head CT is chronic. 3. Background moderate chronic small vessel ischemic changes within the cerebral white matter. 4. Mild ethmoid sinus  mucosal thickening. MRA head: 1. Examination limited by motion degradation. 2. Intracranial atherosclerotic disease with multifocal stenoses, most notably as follows. 3. Moderate atherosclerotic narrowing of the cavernous right ICA. 4. Mild/moderate narrowing of the distal cavernous/paraclinoid left ICA. 5. There are apparent high-grade stenoses within multiple proximal M2 MCA branch vessels bilaterally. Some of these stenoses may be accentuated by motion artifact. 6. High-grade stenosis within the right posterior cerebral artery at the P2-P3 junction. 7. Multifocal moderate stenoses within the P2 left PCA. Multiple moderate to moderately severe stenoses within the P3 and more distal left PCA. Electronically Signed   By: Kellie Simmering DO   On: 05/22/2019 17:17   CT HEAD CODE STROKE WO CONTRAST  Result Date: 05/22/2019 CLINICAL DATA:  Code stroke. Possible stroke; neuro deficit, acute, stroke suspected. Right-sided weakness. EXAM: CT HEAD WITHOUT CONTRAST TECHNIQUE: Contiguous axial images were obtained from the base of the skull through the vertex without intravenous contrast. COMPARISON:  No pertinent prior studies available for comparison. FINDINGS: Brain: There is a 10 mm age-indeterminate lacunar infarct within the left basal ganglia/internal capsule (series 3, image 13) (series 5, image 29). Immediately adjacent there is a chronic appearing lacunar infarct within the posterior limb of left internal capsule (series 3, image 13). Background mild ill-defined hypoattenuation within the cerebral white matter is nonspecific, but consistent with chronic small vessel ischemic disease. Cerebral volume is normal. There is no acute intracranial hemorrhage. No demarcated cortical infarct. No extra-axial fluid collection. No evidence of intracranial mass. No midline shift. Vascular: No hyperdense vessel.  Atherosclerotic calcifications. Skull: Normal. Negative for fracture or focal lesion. Sinuses/Orbits: Visualized  orbits show no acute finding. Mild ethmoid sinus mucosal thickening. No significant mastoid effusion. These results were communicated to Dr. Cheral Marker At 2:16 pmon 5/9/2021by text page via the [facility] messaging system. IMPRESSION: 1. 10 mm age-indeterminate left internal capsule/basal ganglia lacunar infarct. This may be acute. Consider brain MRI for further evaluation. 2. No acute intracranial hemorrhage or visible cortical infarct. 3. Small chronic lacunar infarct within the posterior limb of left internal capsule. Mild background chronic small vessel ischemic changes within the cerebral white matter. 4. Mild ethmoid sinus mucosal thickening. Electronically Signed   By: Kellie Simmering DO   On: 05/22/2019 14:17   VAS US CAROTID  Result Date: 05/23/2019 Carotid Arterial Duplex Study Indications:       Stenosis. Risk Factors:      Hypertension, hyperlipidemia. Comparison Study:  No prior studies. Performing Technologist: Oliver Hum RVT  Examination Guidelines: A complete evaluation includes B-mode imaging, spectral Doppler, color Doppler, and power Doppler as needed of all accessible portions of each vessel. Bilateral testing is considered an integral part of a complete examination. Limited examinations for reoccurring indications may be performed as noted.  Right Carotid Findings: +----------+--------+--------+--------+-----------------------+--------+           PSV cm/sEDV cm/sStenosisPlaque Description     Comments +----------+--------+--------+--------+-----------------------+--------+ CCA Prox  41      6               smooth and heterogenous         +----------+--------+--------+--------+-----------------------+--------+ CCA Distal54      11              smooth and heterogenous         +----------+--------+--------+--------+-----------------------+--------+ ICA Prox  55      6                                                +----------+--------+--------+--------+-----------------------+--------+  ICA Distal104     19                                     tortuous +----------+--------+--------+--------+-----------------------+--------+ ECA       56      6                                               +----------+--------+--------+--------+-----------------------+--------+ +----------+--------+-------+--------+-------------------+           PSV cm/sEDV cmsDescribeArm Pressure (mmHG) +----------+--------+-------+--------+-------------------+ [id number]                                        +----------+--------+-------+--------+-------------------+ +---------+--------+--+--------+-+---------+ VertebralPSV cm/s26EDV cm/s7Antegrade +---------+--------+--+--------+-+---------+  Left Carotid Findings: +----------+--------+--------+--------+-----------------------+--------+           PSV cm/sEDV cm/sStenosisPlaque Description     Comments +----------+--------+--------+--------+-----------------------+--------+ CCA Prox  77      17              smooth and heterogenoustortuous +----------+--------+--------+--------+-----------------------+--------+ CCA Distal44      14              smooth and heterogenous         +----------+--------+--------+--------+-----------------------+--------+ ICA Prox  48      11              smooth and heterogenoustortuous +----------+--------+--------+--------+-----------------------+--------+ ICA Distal60      23                                     tortuous +----------+--------+--------+--------+-----------------------+--------+ ECA       71      7                                               +----------+--------+--------+--------+-----------------------+--------+ +----------+--------+--------+--------+-------------------+           PSV cm/sEDV cm/sDescribeArm Pressure (mmHG) +----------+--------+--------+--------+-------------------+  Subclavian122                                         +----------+--------+--------+--------+-------------------+ +---------+--------+--+--------+-+---------+ VertebralPSV cm/s19EDV cm/s5Antegrade +---------+--------+--+--------+-+---------+   Summary: Right Carotid: Velocities in the right ICA are consistent with a 1-39% stenosis. Left Carotid: Velocities in the left ICA are consistent with a 1-39% stenosis. Vertebrals: Bilateral vertebral arteries demonstrate antegrade flow. *See table(s) above for measurements and observations.  Electronically signed by Antony Contras MD on 05/23/2019 at 2:02:38 PM.    Final    Subjective: She is feeling better. Headaches improved, very mild.  She has her chronic right side numbness, same.   Discharge Exam: Vitals:   05/23/19 0137 05/23/19 0759  BP: (!) 155/78 (!) 147/87  Pulse: 64 64  Resp:  13  Temp: 98.9 F (37.2 C) 97.8 F (36.6 C)  SpO2: 100% 100%     General: Pt is alert, awake, not in acute distress Cardiovascular: RRR, S1/S2 +, no rubs, no gallops Respiratory: CTA bilaterally, no wheezing, no rhonchi  Abdominal: Soft, NT, ND, bowel sounds + Extremities: no edema, no cyanosis    The results of significant diagnostics from this hospitalization (including imaging, microbiology, ancillary and laboratory) are listed below for reference.     Microbiology: Recent Results (from the past 240 hour(s))  Respiratory Panel by RT PCR (Flu A&B, Covid) - Nasopharyngeal Swab     Status: None   Collection Time: 05/22/19  8:57 PM   Specimen: Nasopharyngeal Swab  Result Value Ref Range Status   SARS Coronavirus 2 by RT PCR NEGATIVE NEGATIVE Final    Comment: (NOTE) SARS-CoV-2 target nucleic acids are NOT DETECTED. The SARS-CoV-2 RNA is generally detectable in upper respiratoy specimens during the acute phase of infection. The lowest concentration of SARS-CoV-2 viral copies this assay can detect is 131 copies/mL. A negative result does not  preclude SARS-Cov-2 infection and should not be used as the sole basis for treatment or other patient management decisions. A negative result may occur with  improper specimen collection/handling, submission of specimen other than nasopharyngeal swab, presence of viral mutation(s) within the areas targeted by this assay, and inadequate number of viral copies (<131 copies/mL). A negative result must be combined with clinical observations, patient history, and epidemiological information. The expected result is Negative. Fact Sheet for Patients:  PinkCheek.be Fact Sheet for Healthcare Providers:  GravelBags.it This test is not yet ap proved or cleared by the [place] FDA and  has been authorized for detection and/or diagnosis of SARS-CoV-2 by FDA under an Emergency Use Authorization (EUA). This EUA will remain  in effect (meaning this test can be used) for the duration of the COVID-19 declaration under Section 564(b)(1) of the Act, 21 U.S.C. section 360bbb-3(b)(1), unless the authorization is terminated or revoked sooner.    Influenza A by PCR NEGATIVE NEGATIVE Final   Influenza B by PCR NEGATIVE NEGATIVE Final    Comment: (NOTE) The Xpert Xpress SARS-CoV-2/FLU/RSV assay is intended as an aid in  the diagnosis of influenza from Nasopharyngeal swab specimens and  should not be used as a sole basis for treatment. Nasal washings and  aspirates are unacceptable for Xpert Xpress SARS-CoV-2/FLU/RSV  testing. Fact Sheet for Patients: PinkCheek.be Fact Sheet for Healthcare Providers: GravelBags.it This test is not yet approved or cleared by the [place] FDA and  has been authorized for detection and/or diagnosis of SARS-CoV-2 by  FDA under an Emergency Use Authorization (EUA). This EUA will remain  in effect (meaning this test can be used) for the duration of the   Covid-19 declaration under Section 564(b)(1) of the Act, 21  U.S.C. section 360bbb-3(b)(1), unless the authorization is  terminated or revoked. Performed at [facility], [place] [address], [place], [place] [address]      Labs: BNP (last 3 results) No results for input(s): BNP in the last 8760 hours. Basic Metabolic Panel: Recent Labs  Lab 05/22/19 1401 05/22/19 1436 05/23/19 0643  NA 143 142 142  K 3.7 3.6 3.3*  CL 106 105 107  CO2 26  --  26  GLUCOSE 97 86 83  BUN 17 19 12   CREATININE 1.13* 1.10* 1.04*  CALCIUM 9.5  --  9.0   Liver Function Tests: Recent Labs  Lab 05/22/19 1401 05/23/19 0643  AST 24 24  ALT 21 19  ALKPHOS 98 86  BILITOT 0.6 0.8  PROT 7.3 7.2  ALBUMIN 3.7 3.5   No results for input(s): LIPASE, AMYLASE in the last 168 hours. No results for input(s): AMMONIA in  the last 168 hours. CBC: Recent Labs  Lab 05/22/19 1401 05/22/19 1436 05/23/19 0643  WBC 5.8  --  5.6  NEUTROABS 3.5  --  3.1  HGB 13.2 15.3* 13.5  HCT 41.6 45.0 42.9  MCV 93.1  --  90.7  PLT 241  --  237   Cardiac Enzymes: No results for input(s): CKTOTAL, CKMB, CKMBINDEX, TROPONINI in the last 168 hours. BNP: Invalid input(s): POCBNP CBG: Recent Labs  Lab 05/22/19 1400  GLUCAP 82   D-Dimer No results for input(s): DDIMER in the last 72 hours. Hgb A1c Recent Labs    05/23/19 0227  HGBA1C 6.2*   Lipid Profile Recent Labs    05/23/19 0227  CHOL 158  HDL 53  LDLCALC 92  TRIG 67  CHOLHDL 3.0   Thyroid function studies No results for input(s): TSH, T4TOTAL, T3FREE, THYROIDAB in the last 72 hours.  Invalid input(s): FREET3 Anemia work up No results for input(s): VITAMINB12, FOLATE, FERRITIN, TIBC, IRON, RETICCTPCT in the last 72 hours. Urinalysis No results found for: COLORURINE, APPEARANCEUR, [place], [place], [place], [place], [place], [place], PROTEINUR, UROBILINOGEN, NITRITE, LEUKOCYTESUR Sepsis Labs Invalid input(s): PROCALCITONIN,  WBC,   LACTICIDVEN Microbiology Recent Results (from the past 240 hour(s))  Respiratory Panel by RT PCR (Flu A&B, Covid) - Nasopharyngeal Swab     Status: None   Collection Time: 05/22/19  8:57 PM   Specimen: Nasopharyngeal Swab  Result Value Ref Range Status   SARS Coronavirus 2 by RT PCR NEGATIVE NEGATIVE Final    Comment: (NOTE) SARS-CoV-2 target nucleic acids are NOT DETECTED. The SARS-CoV-2 RNA is generally detectable in upper respiratoy specimens during the acute phase of infection. The lowest concentration of SARS-CoV-2 viral copies this assay can detect is 131 copies/mL. A negative result does not preclude SARS-Cov-2 infection and should not be used as the sole basis for treatment or other patient management decisions. A negative result may occur with  improper specimen collection/handling, submission of specimen other than nasopharyngeal swab, presence of viral mutation(s) within the areas targeted by this assay, and inadequate number of viral copies (<131 copies/mL). A negative result must be combined with clinical observations, patient history, and epidemiological information. The expected result is Negative. Fact Sheet for Patients:  PinkCheek.be Fact Sheet for Healthcare Providers:  GravelBags.it This test is not yet ap proved or cleared by the [place] FDA and  has been authorized for detection and/or diagnosis of SARS-CoV-2 by FDA under an Emergency Use Authorization (EUA). This EUA will remain  in effect (meaning this test can be used) for the duration of the COVID-19 declaration under Section 564(b)(1) of the Act, 21 U.S.C. section 360bbb-3(b)(1), unless the authorization is terminated or revoked sooner.    Influenza A by PCR NEGATIVE NEGATIVE Final   Influenza B by PCR NEGATIVE NEGATIVE Final    Comment: (NOTE) The Xpert Xpress SARS-CoV-2/FLU/RSV assay is intended as an aid in  the diagnosis of influenza  from Nasopharyngeal swab specimens and  should not be used as a sole basis for treatment. Nasal washings and  aspirates are unacceptable for Xpert Xpress SARS-CoV-2/FLU/RSV  testing. Fact Sheet for Patients: PinkCheek.be Fact Sheet for Healthcare Providers: GravelBags.it This test is not yet approved or cleared by the [place] FDA and  has been authorized for detection and/or diagnosis of SARS-CoV-2 by  FDA under an Emergency Use Authorization (EUA). This EUA will remain  in effect (meaning this test can be used) for the duration of the  Covid-19 declaration under Section  564(b)(1) of the Act, 21  U.S.C. section 360bbb-3(b)(1), unless the authorization is  terminated or revoked. Performed at [facility], [place] [address] [place], [place] [address]      Time coordinating discharge: 40 minutes  SIGNED:   Elmarie Shiley, MD  [facility]

## 2019-05-23 NOTE — Progress Notes (Signed)
Patient was stable at discharge. I removed her IV. We reviewed the discharge education. Patient and daughter verbalized understanding and had no further questions. Patient left with belongings in hand. Family aware to pick up new prescription at pt's pharmacy.

## 2019-05-23 NOTE — Progress Notes (Signed)
  Echocardiogram 2D Echocardiogram has been performed.  Christina Austin 05/23/2019, 11:17 AM

## 2019-05-23 NOTE — Progress Notes (Signed)
Carotid artery duplex has been completed. Preliminary results can be found in CV Proc through chart review.   05/23/19 11:59 AM Christina Austin RVT

## 2019-05-23 NOTE — Discharge Instructions (Signed)
Take plavix every day. Your cholesterol medication was increase to 20 mg daily.  Keep yourself hydrated. Check BP at home. Ok to resume Norvasc. Use stocking compression.

## 2019-05-23 NOTE — Plan of Care (Signed)
Care plan goals met. Pt adequate for discharge.  

## 2019-05-23 NOTE — Social Work (Signed)
CSW met with pt at bedside. CSW introduced self and explained her role. CSW completed sbirt with pt.  Pt scored a 0 on the sbirt scale. Pt denied alcohol use. Pt denied substance use. Pt did not need resources at this time.  Emeterio Reeve, Latanya Presser, [place] Social Worker [phone]

## 2019-05-23 NOTE — TOC Progression Note (Addendum)
Transition of Care [facility]) - Progression Note    Patient Details  Name: Christina Austin MRN: [id number] Date of Birth: March 02, 1934  [facility]) CM/SW [place], [place] Phone Number: 05/23/2019, 2:58 PM  Clinical Narrative:     CSW met with pt for [facility] consult. Pt. Is unsure of her level of need for PT though is open to recommendation for PT. Agrees to allow CSW to pursue HHPT; no preferred agency.   CSW reached out to the following who were unable to take pt for PT:  [facility], [facility], [facility], [facility], [facility], [facility], [facility], [facility], [facility] (out of network and pt has no out of network benefits)  Waiting on [employer] and [employer]   1555: Met with pt and pt daughter at bedside. CSW explained that no HH companies would be able to take pt for [facility] yet. CSW explained that still waiting on more responses from [facility] providers and would follow up as late as the following day. CSW explained that at that point pt would likely need to go through PCP for assistance for getting HH.          Expected Discharge Plan and Services                                                 Social Determinants of Health (SDOH) Interventions    Readmission Risk Interventions No flowsheet data found.

## 2019-05-23 NOTE — Progress Notes (Addendum)
STROKE TEAM PROGRESS NOTE   INTERVAL HISTORY Patient daughter is at the bedside.  I personally reviewed history of presenting illness with the patient, electronic medical records and imaging films in PACS. She presented with sudden onset of transient right hemiparesis with mild headache which resolved up upon arrival.  Blood pressure significantly elevated which has since improved.  She has had no recurrent stroke or TIA symptoms.  MRI scan of the brain shows no acute infarct but does show old lacunar infarcts.  MRA is suboptimal due to motion degradation and noncontrast technique shows bilateral atherosclerotic changes of carotid siphons.  MRI of the neck shows 70% right ICA stenosis and carotid Doppler shows only 1-39% bilateral carotid stenosis LDL cholesterol 92 mg percent hemoglobin A1c 6.2. Vitals:   05/23/19 0137 05/23/19 0138 05/23/19 0139 05/23/19 0759  BP: (!) 155/78   (!) 147/87  Pulse: 64   64  Resp:    13  Temp: 98.9 F (37.2 C)   97.8 F (36.6 C)  TempSrc: Oral   Oral  SpO2: 100%   100%  Weight:  63.3 kg    Height:   5\' 2"  (1.575 m)     CBC:  Recent Labs  Lab 05/22/19 1401 05/22/19 1401 05/22/19 1436 05/23/19 0643  WBC 5.8  --   --  5.6  NEUTROABS 3.5  --   --  3.1  HGB 13.2   < > 15.3* 13.5  HCT 41.6   < > 45.0 42.9  MCV 93.1  --   --  90.7  PLT 241  --   --  237   < > = values in this interval not displayed.    Basic Metabolic Panel:  Recent Labs  Lab 05/22/19 1401 05/22/19 1401 05/22/19 1436 05/23/19 0643  NA 143   < > 142 142  K 3.7   < > 3.6 3.3*  CL 106   < > 105 107  CO2 26  --   --  26  GLUCOSE 97   < > 86 83  BUN 17   < > 19 12  CREATININE 1.13*   < > 1.10* 1.04*  CALCIUM 9.5  --   --  9.0   < > = values in this interval not displayed.   Lipid Panel:     Component Value Date/Time   CHOL 158 05/23/2019 0227   TRIG 67 05/23/2019 0227   HDL 53 05/23/2019 0227   CHOLHDL 3.0 05/23/2019 0227   VLDL 13 05/23/2019 0227   LDLCALC 92 05/23/2019  0227   HgbA1c:  Lab Results  Component Value Date   HGBA1C 6.2 (H) 05/23/2019   Urine Drug Screen: No results found for: LABOPIA, COCAINSCRNUR, LABBENZ, AMPHETMU, THCU, LABBARB  Alcohol Level No results found for: ETH  IMAGING past 24 hours MR ANGIO HEAD WO CONTRAST  Result Date: 05/22/2019 CLINICAL DATA:  Neuro deficit, acute, stroke suspected. EXAM: MRI HEAD WITHOUT CONTRAST MRA HEAD WITHOUT CONTRAST TECHNIQUE: Multiplanar, multiecho pulse sequences of the brain and surrounding structures were obtained without intravenous contrast. Angiographic images of the head were obtained using MRA technique without contrast. COMPARISON:  Noncontrast head CT performed earlier the same day 05/22/2019. FINDINGS: MRI HEAD FINDINGS Brain: There is moderate motion degradation of the coronal T2 weighted sequence. Moderate patchy T2/FLAIR hyperintensity within the cerebral white matter is nonspecific, but consistent with chronic small vessel ischemic disease. There are chronic lacunar infarcts within the bilateral basal ganglia/internal capsules. Nonspecific chronic microhemorrhages within the splenium  of the corpus callosum and left basal ganglia. Cerebral volume is normal for age. There is no acute infarct. No evidence of intracranial mass. No chronic intracranial blood products. No extra-axial fluid collection. No midline shift. Vascular: Expected proximal arterial flow voids. Skull and upper cervical spine: No focal marrow lesion. Sinuses/Orbits: Bilateral lens replacements. Mild ethmoid sinus mucosal thickening. No significant mastoid effusion. MRA HEAD FINDINGS Examination limited by motion degradation. Intracranial internal carotid arteries are patent. Moderate atherosclerotic narrowing of the right cavernous segment. Mild-to-moderate narrowing of the distal cavernous/paraclinoid segment on the left. The M1 middle cerebral arteries are patent without significant stenosis. Motion limits evaluation of the M2 and  more distal MCA branch vessels. However, there are apparent moderate/severe focal stenoses within superior and inferior division proximal right M2 MCA branches (series 1031, image 8) (series 1031, image 3). Apparent high-grade focal stenosis within a proximal left M2 inferior division branch (series 1051, image 4). 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 Azygos configuration of the A2 and A3 anterior cerebral arteries. No significant proximal stenosis. The intracranial vertebral arteries are patent without significant stenosis, as is the basilar artery. The posterior cerebral arteries are patent bilaterally. High-grade stenosis within the right posterior cerebral artery at the P2-P3 junction (series 1067, image 15). Multiple sites of moderate stenosis within the P2 left posterior cerebral artery. Multifocal moderate to moderately severe stenoses within the P3 and more distal left PCA. A left posterior communicating artery is present. The right posterior communicating artery is hypoplastic or absent. No intracranial aneurysm is identified. IMPRESSION: MRI brain: 1. No evidence of acute intracranial abnormality, including acute infarction. 2. Chronic lacunar infarcts within the bilateral internal capsules/basal ganglia. Specifically, the age-indeterminate lacunar infarct described on earlier head CT is chronic. 3. Background moderate chronic small vessel ischemic changes within the cerebral white matter. 4. Mild ethmoid sinus mucosal thickening. MRA head: 1. Examination limited by motion degradation. 2. Intracranial atherosclerotic disease with multifocal stenoses, most notably as follows. 3. Moderate atherosclerotic narrowing of the cavernous right ICA. 4. Mild/moderate narrowing of the distal cavernous/paraclinoid left ICA. 5. There are apparent high-grade stenoses within multiple proximal M2 MCA branch vessels bilaterally. Some of these stenoses may be accentuated by motion artifact. 6.  High-grade stenosis within the right posterior cerebral artery at the P2-P3 junction. 7. Multifocal moderate stenoses within the P2 left PCA. Multiple moderate to moderately severe stenoses within the P3 and more distal left PCA. Electronically Signed   By: Kellie Simmering DO   On: 05/22/2019 17:17   MR ANGIO NECK WO CONTRAST  Result Date: 05/22/2019 CLINICAL DATA:  Neuro deficit, acute, stroke suspected. EXAM: MRA NECK WITHOUT CONTRAST TECHNIQUE: Angiographic images of the neck were obtained using MRA technique without intravenous contrast. Carotid stenosis measurements (when applicable) are obtained utilizing NASCET criteria, using the distal internal carotid diameter as the denominator. COMPARISON:  No pertinent prior studies available for comparison. FINDINGS: Examination limited by motion degradation and noncontrast technique. There is standard aortic branching. Motion artifact and noncontrast technique precludes adequate evaluation for stenoses within the distal innominate artery, proximal subclavian arteries, proximal common carotid arteries and proximal vertebral arteries. The common and internal carotid arteries are patent within the neck bilaterally with antegrade flow. There is no appreciable hemodynamically significant stenosis within the mid or distal common carotid arteries. There is narrowing at the origin of the right ICA which may approach or exceed 70% (series 16, image 50). There is no appreciable significant stenosis of the cervical left ICA (  50% or greater). The vertebral arteries are patent within the neck bilaterally with antegrade flow. Probable stenoses at the origin of the right vertebral artery and within the proximal V1 left vertebral artery which are poorly quantified for reasons described. No appreciable significant stenosis more distally within the imaged cervical vertebral arteries. There is a short segment fenestration within the mid V2 left vertebral artery. IMPRESSION: Examination  limited by motion degradation and noncontrast technique. This precludes adequate evaluation for stenoses within the distal innominate artery, proximal subclavian arteries, proximal common carotid arteries and proximal vertebral arteries. The common and internal carotid arteries are patent within the neck with antegrade flow. There is narrowing at the origin of the right ICA which may approach or exceed 70%. Carotid artery duplex is recommended for further evaluation. No hemodynamically significant stenosis of the cervical left ICA. The vertebral arteries are patent within the neck bilaterally with antegrade flow. Probable stenoses at the origin of the right vertebral artery and within the proximal V1 left vertebral artery which are poorly quantified for reasons described. No appreciable significant stenosis more distally within the imaged cervical vertebral arteries. Electronically Signed   By: Kellie Simmering DO   On: 05/22/2019 17:36   MR BRAIN WO CONTRAST  Result Date: 05/22/2019 CLINICAL DATA:  Neuro deficit, acute, stroke suspected. EXAM: MRI HEAD WITHOUT CONTRAST MRA HEAD WITHOUT CONTRAST TECHNIQUE: Multiplanar, multiecho pulse sequences of the brain and surrounding structures were obtained without intravenous contrast. Angiographic images of the head were obtained using MRA technique without contrast. COMPARISON:  Noncontrast head CT performed earlier the same day 05/22/2019. FINDINGS: MRI HEAD FINDINGS Brain: There is moderate motion degradation of the coronal T2 weighted sequence. Moderate patchy T2/FLAIR hyperintensity within the cerebral white matter is nonspecific, but consistent with chronic small vessel ischemic disease. There are chronic lacunar infarcts within the bilateral basal ganglia/internal capsules. Nonspecific chronic microhemorrhages within the splenium of the corpus callosum and left basal ganglia. Cerebral volume is normal for age. There is no acute infarct. No evidence of intracranial  mass. No chronic intracranial blood products. No extra-axial fluid collection. No midline shift. Vascular: Expected proximal arterial flow voids. Skull and upper cervical spine: No focal marrow lesion. Sinuses/Orbits: Bilateral lens replacements. Mild ethmoid sinus mucosal thickening. No significant mastoid effusion. MRA HEAD FINDINGS Examination limited by motion degradation. Intracranial internal carotid arteries are patent. Moderate atherosclerotic narrowing of the right cavernous segment. Mild-to-moderate narrowing of the distal cavernous/paraclinoid segment on the left. The M1 middle cerebral arteries are patent without significant stenosis. Motion limits evaluation of the M2 and more distal MCA branch vessels. However, there are apparent moderate/severe focal stenoses within superior and inferior division proximal right M2 MCA branches (series 1031, image 8) (series 1031, image 3). Apparent high-grade focal stenosis within a proximal left M2 inferior division branch (series 1051, image 4). 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 Azygos configuration of the A2 and A3 anterior cerebral arteries. No significant proximal stenosis. The intracranial vertebral arteries are patent without significant stenosis, as is the basilar artery. The posterior cerebral arteries are patent bilaterally. High-grade stenosis within the right posterior cerebral artery at the P2-P3 junction (series 1067, image 15). Multiple sites of moderate stenosis within the P2 left posterior cerebral artery. Multifocal moderate to moderately severe stenoses within the P3 and more distal left PCA. A left posterior communicating artery is present. The right posterior communicating artery is hypoplastic or absent. No intracranial aneurysm is identified. IMPRESSION: MRI brain: 1. No evidence of acute  intracranial abnormality, including acute infarction. 2. Chronic lacunar infarcts within the bilateral internal capsules/basal  ganglia. Specifically, the age-indeterminate lacunar infarct described on earlier head CT is chronic. 3. Background moderate chronic small vessel ischemic changes within the cerebral white matter. 4. Mild ethmoid sinus mucosal thickening. MRA head: 1. Examination limited by motion degradation. 2. Intracranial atherosclerotic disease with multifocal stenoses, most notably as follows. 3. Moderate atherosclerotic narrowing of the cavernous right ICA. 4. Mild/moderate narrowing of the distal cavernous/paraclinoid left ICA. 5. There are apparent high-grade stenoses within multiple proximal M2 MCA branch vessels bilaterally. Some of these stenoses may be accentuated by motion artifact. 6. High-grade stenosis within the right posterior cerebral artery at the P2-P3 junction. 7. Multifocal moderate stenoses within the P2 left PCA. Multiple moderate to moderately severe stenoses within the P3 and more distal left PCA. Electronically Signed   By: Kellie Simmering DO   On: 05/22/2019 17:17   CT HEAD CODE STROKE WO CONTRAST  Result Date: 05/22/2019 CLINICAL DATA:  Code stroke. Possible stroke; neuro deficit, acute, stroke suspected. Right-sided weakness. EXAM: CT HEAD WITHOUT CONTRAST TECHNIQUE: Contiguous axial images were obtained from the base of the skull through the vertex without intravenous contrast. COMPARISON:  No pertinent prior studies available for comparison. FINDINGS: Brain: There is a 10 mm age-indeterminate lacunar infarct within the left basal ganglia/internal capsule (series 3, image 13) (series 5, image 29). Immediately adjacent there is a chronic appearing lacunar infarct within the posterior limb of left internal capsule (series 3, image 13). Background mild ill-defined hypoattenuation within the cerebral white matter is nonspecific, but consistent with chronic small vessel ischemic disease. Cerebral volume is normal. There is no acute intracranial hemorrhage. No demarcated cortical infarct. No extra-axial  fluid collection. No evidence of intracranial mass. No midline shift. Vascular: No hyperdense vessel.  Atherosclerotic calcifications. Skull: Normal. Negative for fracture or focal lesion. Sinuses/Orbits: Visualized orbits show no acute finding. Mild ethmoid sinus mucosal thickening. No significant mastoid effusion. These results were communicated to Dr. Cheral Marker At 2:16 pmon 5/9/2021by text page via the [facility] messaging system. IMPRESSION: 1. 10 mm age-indeterminate left internal capsule/basal ganglia lacunar infarct. This may be acute. Consider brain MRI for further evaluation. 2. No acute intracranial hemorrhage or visible cortical infarct. 3. Small chronic lacunar infarct within the posterior limb of left internal capsule. Mild background chronic small vessel ischemic changes within the cerebral white matter. 4. Mild ethmoid sinus mucosal thickening. Electronically Signed   By: Kellie Simmering DO   On: 05/22/2019 14:17   VAS US CAROTID  Result Date: 05/23/2019 Carotid Arterial Duplex Study Indications:       Stenosis. Risk Factors:      Hypertension, hyperlipidemia. Comparison Study:  No prior studies. Performing Technologist: Oliver Hum RVT  Examination Guidelines: A complete evaluation includes B-mode imaging, spectral Doppler, color Doppler, and power Doppler as needed of all accessible portions of each vessel. Bilateral testing is considered an integral part of a complete examination. Limited examinations for reoccurring indications may be performed as noted.  Right Carotid Findings: +----------+--------+--------+--------+-----------------------+--------+           PSV cm/sEDV cm/sStenosisPlaque Description     Comments +----------+--------+--------+--------+-----------------------+--------+ CCA Prox  41      6               smooth and heterogenous         +----------+--------+--------+--------+-----------------------+--------+ CCA Distal54      11  smooth and heterogenous          +----------+--------+--------+--------+-----------------------+--------+ ICA Prox  55      6                                               +----------+--------+--------+--------+-----------------------+--------+ ICA Distal104     19                                     tortuous +----------+--------+--------+--------+-----------------------+--------+ ECA       56      6                                               +----------+--------+--------+--------+-----------------------+--------+ +----------+--------+-------+--------+-------------------+           PSV cm/sEDV cmsDescribeArm Pressure (mmHG) +----------+--------+-------+--------+-------------------+ [id number]                                        +----------+--------+-------+--------+-------------------+ +---------+--------+--+--------+-+---------+ VertebralPSV cm/s26EDV cm/s7Antegrade +---------+--------+--+--------+-+---------+  Left Carotid Findings: +----------+--------+--------+--------+-----------------------+--------+           PSV cm/sEDV cm/sStenosisPlaque Description     Comments +----------+--------+--------+--------+-----------------------+--------+ CCA Prox  77      17              smooth and heterogenoustortuous +----------+--------+--------+--------+-----------------------+--------+ CCA Distal44      14              smooth and heterogenous         +----------+--------+--------+--------+-----------------------+--------+ ICA Prox  48      11              smooth and heterogenoustortuous +----------+--------+--------+--------+-----------------------+--------+ ICA Distal60      23                                     tortuous +----------+--------+--------+--------+-----------------------+--------+ ECA       71      7                                               +----------+--------+--------+--------+-----------------------+--------+  +----------+--------+--------+--------+-------------------+           PSV cm/sEDV cm/sDescribeArm Pressure (mmHG) +----------+--------+--------+--------+-------------------+ Subclavian122                                         +----------+--------+--------+--------+-------------------+ +---------+--------+--+--------+-+---------+ VertebralPSV cm/s19EDV cm/s5Antegrade +---------+--------+--+--------+-+---------+   Summary: Right Carotid: Velocities in the right ICA are consistent with a 1-39% stenosis. Left Carotid: Velocities in the left ICA are consistent with a 1-39% stenosis. Vertebrals: Bilateral vertebral arteries demonstrate antegrade flow. *See table(s) above for measurements and observations.     Preliminary     PHYSICAL EXAM Pleasant elderly Caucasian lady not in distress. . Afebrile. Head is nontraumatic. Neck is supple without bruit.    Cardiac exam no  murmur or gallop. Lungs are clear to auscultation. Distal pulses are well felt. Neurological Exam ;  Awake  Alert oriented x 3. Normal speech and language.eye movements full without nystagmus.fundi were not visualized. Vision acuity and fields appear normal. Hearing is normal. Palatal movements are normal. Face symmetric. Tongue midline. Normal strength, tone, reflexes and coordination. Normal sensation. Gait deferred.  ASSESSMENT/PLAN Christina Austin is a 85 y.o. female with history of HTN, CKD, HLD and TIA presenting with R sided weakness.   L brain TIA in setting of multifocal intracranial stenosis   Code Stroke CT head internal capsule / basal ganglia lacune ? Age. Old PLIC lacune. Small vessel disease. Sinus dz.  MRI  No acute abnormality. Old B internal capsule / BG lacunes. Small vessel disease. Sinus dz.  MRA head  Multifocal stenoses (R cavernous ICA moderate, distal caverous/paraclinoid L ICA mild / moderate, multiple proximal B M2 high-grade, R PCA P2 high-grade, multifocal L P2 moderate, L P3 moderate  severe)  MRA neck  Motion. narrowinr origin R ICA > 70%. Origin R VA and L VA stenoses.   Carotid Doppler  B ICA 1-39% stenosis, VAs antegrade   2D Echo pending  LDL 92  HgbA1c 6.2  Lovenox 40 mg sq daily for VTE prophylaxis  clopidogrel 75 mg daily prior to admission, now on clopidogrel 75 mg daily. Given hx GIB on aspirin, continue plavix alone.   Therapy recommendations:  HH PT, no OT  Disposition:  pending   Followed by Dr. Tomi Likens for recurrent facial numbness - return to him at d/c  Hypertension  Stable . BP goal 130-150  Hyperlipidemia  Home meds:  lipitor 10, resumed in hospital  LDL 92, goal < 70  Given intracranial atherosclerosis and advanced age, increase statin to 20    Continue statin at discharge  Other Stroke Risk Factors  Advanced age  Hx stroke/TIA  Recurrent facial numbness followed by Dr. Tomi Likens. Last seen by him 08/12/13  11/03/2012 - admission to [facility]. TIA. R facial warmth, droop and RUE weakness x 5 mins  On estrace vaginal cream    Other Active Problems  HA, etiology unclear  depression  Hospital day # 0 She presented with transient right hemiparesis likely left hemispheric TIA from small vessel disease.  Patient has history of GI bleed on aspirin hence recommend continue Plavix 75 mg daily alone and maintain aggressive risk factor modification.  Check echocardiogram results discussed with patient and Dr. Lavonna Monarch.  Greater than 50% time during this 35-minute visit was spent on counseling and coordination of care and answering questions. Antony Contras, MD Medical Director [facility] Pager: [phone] 05/23/2019 2:55 PM To contact Stroke Continuity provider, please refer to [email or web address] After hours, contact General Neurology

## 2019-05-23 NOTE — Evaluation (Signed)
Physical Therapy Evaluation Patient Details Name: Christina Austin MRN: [id number] DOB: Jun 13, 1934 Today's Date: 05/23/2019   History of Present Illness  Pt is an 85 yo female presenting with c/o worsening headache and R-sided weakness. The pt's HA onset 3 days PTA (5/7), but resolved later that day. Imaging revealed no aute abnormalities, suspect TIA. PMH includes: HTN, HLD, depression, and multiple TIAs.   Clinical Impression  Pt in bed upon arrival of PT, agreeable to evaluation at this time. Prior to admission the pt was independent with mobility, and able to navigate a full flight of stairs multiple times each day without assist. She lives with her daughter who could assist as needed, but PTA was not performing or assisting pt with any ADLs. The pt now presents with limitations in functional mobility, endurance, and dynamic stability due to above dx, and will continue to benefit from skilled PT to address these deficits. The pt's strength was symmetrical, but the pt became significantly fatigued with 200 ft ambulation in the hallway, and required additional HHA for stability as well as seated rest to recover. The pt will continue to benefit from skilled PT acutely and following d/c to improve functional endurance and to establish muscular strengthening program for improved safety in the home.   Dynamic Gait Index (DGI): 18/24 (<19 indicates increased risk for falls)     Follow Up Recommendations Home health PT;Supervision for mobility/OOB    Equipment Recommendations  None recommended by PT    Recommendations for Other Services       Precautions / Restrictions Precautions Precautions: None Restrictions Weight Bearing Restrictions: No      Mobility  Bed Mobility Overal bed mobility: Modified Independent             General bed mobility comments: HOB elevated  Transfers Overall transfer level: Needs assistance Equipment used: None Transfers: Sit to/from Stand Sit to  Stand: Supervision         General transfer comment: for general safety and balance  Ambulation/Gait Ambulation/Gait assistance: Min guard Gait Distance (Feet): 200 Feet Assistive device: None Gait Pattern/deviations: Step-through pattern;Decreased stride length Gait velocity: 0.6 m/s Gait velocity interpretation: 1.31 - 2.62 ft/sec, indicative of limited community ambulator General Gait Details: pt with grossly functional ambulation with moments of increased lateral movment, able to recover without assist. Sig increased reaching for railings/objects for last 50 ft, pt reports sig fatigue  Stairs            Wheelchair Mobility    Modified Rankin (Stroke Patients Only)       Balance Overall balance assessment: Needs assistance Sitting-balance support: Feet supported Sitting balance-Leahy Scale: Good     Standing balance support: No upper extremity supported;During functional activity Standing balance-Leahy Scale: Fair Standing balance comment: tends to seek single UE support (when able) for added stability                   Standardized Balance Assessment Standardized Balance Assessment : Dynamic Gait Index   Dynamic Gait Index Level Surface: Normal Change in Gait Speed: Mild Impairment Gait with Horizontal Head Turns: Mild Impairment Gait with Vertical Head Turns: Mild Impairment Gait and Pivot Turn: Normal Step Over Obstacle: Mild Impairment Step Around Obstacles: Mild Impairment Steps: Mild Impairment Total Score: 18       Pertinent Vitals/Pain Pain Assessment: Faces Faces Pain Scale: Hurts little more Pain Location: headache (reports improved since initial admit), continues to report soreness in R shoulder/cervical region Pain Descriptors / Indicators: Headache;Sore  Pain Intervention(s): Monitored during session;Repositioned    Home Living Family/patient expects to be discharged to:: Private residence Living Arrangements: Children Available  Help at Discharge: Family Type of Home: House Home Access: Level entry     Home Layout: Two level Home Equipment: Cane - single point;Grab bars - tub/shower      Prior Function Level of Independence: Independent         Comments: daughter drives      Hand Dominance   Dominant Hand: Right    Extremity/Trunk Assessment   Upper Extremity Assessment Upper Extremity Assessment: Defer to OT evaluation    Lower Extremity Assessment Lower Extremity Assessment: Generalized weakness(symmetrical, pt fatigues quickly)    Cervical / Trunk Assessment Cervical / Trunk Assessment: Normal  Communication   Communication: No difficulties  Cognition Arousal/Alertness: Awake/alert Behavior During Therapy: WFL for tasks assessed/performed Overall Cognitive Status: Within Functional Limits for tasks assessed                                        General Comments General comments (skin integrity, edema, etc.): VSS, pt reporting sig fatigue following 200 ft ambulation    Exercises     Assessment/Plan    PT Assessment Patient needs continued PT services  PT Problem List Decreased strength;Decreased mobility;Decreased range of motion;Decreased activity tolerance;Decreased balance;Decreased coordination       PT Treatment Interventions Gait training;Stair training;Functional mobility training;Therapeutic activities;Patient/family education;Balance training;Therapeutic exercise    PT Goals (Current goals can be found in the Care Plan section)  Acute Rehab PT Goals Patient Stated Goal: maintain her independence  PT Goal Formulation: With patient Time For Goal Achievement: 06/06/19 Potential to Achieve Goals: Good    Frequency Min 3X/week   Barriers to discharge        Co-evaluation               AM-PAC PT "6 Clicks" Mobility  Outcome Measure Help needed turning from your back to your side while in a flat bed without using bedrails?: None Help needed  moving from lying on your back to sitting on the side of a flat bed without using bedrails?: None Help needed moving to and from a bed to a chair (including a wheelchair)?: A Little Help needed standing up from a chair using your arms (e.g., wheelchair or bedside chair)?: A Little Help needed to walk in hospital room?: A Little Help needed climbing 3-5 steps with a railing? : A Little 6 Click Score: 20    End of Session Equipment Utilized During Treatment: Gait belt Activity Tolerance: Patient tolerated treatment well;Patient limited by fatigue Patient left: in bed;with call bell/phone within reach Nurse Communication: Mobility status PT Visit Diagnosis: Difficulty in walking, not elsewhere classified (R26.2);Muscle weakness (generalized) (M62.81)    Time: [id number] PT Time Calculation (min) (ACUTE ONLY): 29 min   Charges:   PT Evaluation $PT Eval Moderate Complexity: 1 Mod PT Treatments $Gait Training: 8-22 mins        Karma Ganja, PT, DPT   Acute Rehabilitation Department Pager #: [phone]  Otho Bellows 05/23/2019, 11:17 AM

## 2019-05-23 NOTE — Evaluation (Signed)
Occupational Therapy Evaluation Patient Details Name: Christina Austin MRN: [id number] DOB: [birth year] Today's Date: 05/23/2019    History of Present Illness Pt is an 85 yo female presenting with c/o worsening headache and R-sided weakness. The pt's HA onset 3 days PTA (5/7), but resolved later that day. Imaging revealed no aute abnormalities, suspect TIA. PMH includes: HTN, HLD, depression, and multiple TIAs.    Clinical Impression   This 85 y/o female presents with the above. PTA pt reports independence with ADL and functional mobility; she lives with her daughter who assists with driving. Pt presents seated EOB pleasant and willing to participate in therapy session; presenting with overall general weakness and mild unsteadiness with mobility tasks. Pt completing ADL and functional mobility without AD overall at supervision level. Pt with tendency to seek single UE support on items in room during mobility, reports she typically does so at home as well. Pt continues to endorse some "soreness" in proximal aspect of RUE but with symmetrical weakness noted in bil UEs (gross weakness), ROM WFL. She reports continued headache but with improvements since initial admit. Pt will benefit from continued acute OT services to further maximize her overall strength, safety and independence with ADL and mobility tasks; do not anticipate she will require follow up OT services.     Follow Up Recommendations  No OT follow up;Supervision/Assistance - 24 hour(close to 24hr initially)    Equipment Recommendations  None recommended by OT           Precautions / Restrictions Precautions Precautions: None Restrictions Weight Bearing Restrictions: No      Mobility Bed Mobility Overal bed mobility: Modified Independent             General bed mobility comments: HOB elevated  Transfers Overall transfer level: Needs assistance Equipment used: None Transfers: Sit to/from Stand Sit to Stand:  Supervision         General transfer comment: for general safety and balance    Balance Overall balance assessment: Needs assistance Sitting-balance support: Feet supported Sitting balance-Leahy Scale: Good     Standing balance support: No upper extremity supported;During functional activity Standing balance-Leahy Scale: Fair Standing balance comment: tends to seek single UE support (when able) for added stability                             ADL either performed or assessed with clinical judgement   ADL Overall ADL's : Needs assistance/impaired Eating/Feeding: Modified independent;Sitting   Grooming: Wash/dry hands;Supervision/safety;Standing   Upper Body Bathing: Supervision/ safety;Sitting   Lower Body Bathing: Supervison/ safety;Sit to/from stand   Upper Body Dressing : Modified independent;Sitting   Lower Body Dressing: Supervision/safety;Sit to/from stand   Toilet Transfer: Supervision/safety;Ambulation Toilet Transfer Details (indicate cue type and reason): simulated via transfer to/from EOB, room level mobility  Toileting- Clothing Manipulation and Hygiene: Supervision/safety;Sit to/from stand Toileting - Clothing Manipulation Details (indicate cue type and reason): pt reports has been up ad lib to bathroom this AM   Tub/Shower Transfer Details (indicate cue type and reason): pt reports she feels comfortable standing in shower for bathing task given use of grab bars for stability  Functional mobility during ADLs: Supervision/safety General ADL Comments: pt with general weakness and mild imbalances, tends to reach out for single UE support (light support) when mobilizing in room     Vision Baseline Vision/History: Wears glasses Wears Glasses: Reading only Patient Visual Report: No change from baseline Vision Assessment?: No  apparent visual deficits     Perception     Praxis      Pertinent Vitals/Pain Pain Assessment: Faces Faces Pain Scale:  Hurts little more Pain Location: headache (reports improved since initial admit), continues to report soreness in R shoulder/cervical region Pain Descriptors / Indicators: Headache;Sore Pain Intervention(s): Monitored during session;Repositioned     Hand Dominance Right   Extremity/Trunk Assessment Upper Extremity Assessment Upper Extremity Assessment: Generalized weakness(strength symmetrical, gross weakness bil UEs)   Lower Extremity Assessment Lower Extremity Assessment: Defer to PT evaluation   Cervical / Trunk Assessment Cervical / Trunk Assessment: Normal   Communication Communication Communication: No difficulties   Cognition Arousal/Alertness: Awake/alert Behavior During Therapy: WFL for tasks assessed/performed Overall Cognitive Status: Within Functional Limits for tasks assessed                                     General Comments       Exercises     Shoulder Instructions      Home Living Family/patient expects to be discharged to:: Private residence Living Arrangements: Children Available Help at Discharge: Family Type of Home: House Home Access: Level entry     Home Layout: Two level Alternate Level Stairs-Number of Steps: 12 Alternate Level Stairs-Rails: Right;Left(L side half way, R side entire way) Bathroom Shower/Tub: Teacher, early years/pre: (comfort height)     Home Equipment: Cane - single point;Grab bars - tub/shower          Prior Functioning/Environment Level of Independence: Independent        Comments: daughter drives         OT Problem List: Decreased strength;Decreased range of motion;Impaired balance (sitting and/or standing);Pain;Decreased activity tolerance      OT Treatment/Interventions: Self-care/ADL training;Therapeutic exercise;Energy conservation;DME and/or AE instruction;Therapeutic activities;Patient/family education;Balance training    OT Goals(Current goals can be found in the care  plan section) Acute Rehab OT Goals Patient Stated Goal: maintain her independence  OT Goal Formulation: With patient Time For Goal Achievement: 06/06/19 Potential to Achieve Goals: Good  OT Frequency: Min 2X/week   Barriers to D/C:            Co-evaluation              AM-PAC OT "6 Clicks" Daily Activity     Outcome Measure Help from another person eating meals?: None Help from another person taking care of personal grooming?: A Little Help from another person toileting, which includes using toliet, bedpan, or urinal?: A Little Help from another person bathing (including washing, rinsing, drying)?: A Little Help from another person to put on and taking off regular upper body clothing?: None Help from another person to put on and taking off regular lower body clothing?: A Little 6 Click Score: 20   End of Session Nurse Communication: Mobility status  Activity Tolerance: Patient tolerated treatment well Patient left: in bed;with call bell/phone within reach  OT Visit Diagnosis: Muscle weakness (generalized) (M62.81);Unsteadiness on feet (R26.81)                Time: [id number] OT Time Calculation (min): 23 min Charges:  OT General Charges $OT Visit: 1 Visit OT Evaluation $OT Eval Moderate Complexity: 1 Mod OT Treatments $Self Care/Home Management : 8-22 mins  Lou Cal, OT Acute Rehabilitation Services Pager [phone] Office [place] 05/23/2019, 10:08 AM

## 2019-05-23 NOTE — ED Notes (Signed)
Attempted report x1. 

## 2019-05-24 NOTE — TOC Progression Note (Signed)
Transition of Care [facility]) - Progression Note    Patient Details  Name: Christina Austin MRN: [id number] Date of Birth: 03-Dec-1934  Transition of Care [facility]) CM/SW [place], [place] Phone Number: 05/24/2019, 10:35 AM  Clinical Narrative:     Referral accepted by [facility] from [facility]. CSW called and notified daughter Jinny Sanders.        Expected Discharge Plan and Services           Expected Discharge Date: 05/23/19                                     Social Determinants of Health (SDOH) Interventions    Readmission Risk Interventions No flowsheet data found.

## 2019-05-26 DIAGNOSIS — I10 Essential (primary) hypertension: Secondary | ICD-10-CM | POA: Diagnosis not present

## 2019-05-26 DIAGNOSIS — E785 Hyperlipidemia, unspecified: Secondary | ICD-10-CM | POA: Diagnosis not present

## 2019-05-26 DIAGNOSIS — E876 Hypokalemia: Secondary | ICD-10-CM | POA: Diagnosis not present

## 2019-05-26 DIAGNOSIS — G459 Transient cerebral ischemic attack, unspecified: Secondary | ICD-10-CM | POA: Diagnosis not present

## 2019-06-04 DIAGNOSIS — W19XXXA Unspecified fall, initial encounter: Secondary | ICD-10-CM | POA: Diagnosis not present

## 2019-06-04 DIAGNOSIS — S6992XA Unspecified injury of left wrist, hand and finger(s), initial encounter: Secondary | ICD-10-CM | POA: Diagnosis not present

## 2019-06-04 DIAGNOSIS — S62357A Nondisplaced fracture of shaft of fifth metacarpal bone, left hand, initial encounter for closed fracture: Secondary | ICD-10-CM | POA: Diagnosis not present

## 2019-06-04 DIAGNOSIS — S80211A Abrasion, right knee, initial encounter: Secondary | ICD-10-CM | POA: Diagnosis not present

## 2019-06-04 DIAGNOSIS — S0083XA Contusion of other part of head, initial encounter: Secondary | ICD-10-CM | POA: Diagnosis not present

## 2019-06-05 ENCOUNTER — Other Ambulatory Visit: Payer: Self-pay

## 2019-06-05 ENCOUNTER — Ambulatory Visit (HOSPITAL_COMMUNITY)
Admission: EM | Admit: 2019-06-05 | Discharge: 2019-06-05 | Disposition: A | Discharge status: Home / Self Care | Payer: Medicare Other | Source: Home / Self Care

## 2019-06-05 ENCOUNTER — Encounter (HOSPITAL_COMMUNITY): Payer: Self-pay

## 2019-06-05 ENCOUNTER — Emergency Department (HOSPITAL_COMMUNITY)
Admission: EM | Admit: 2019-06-05 | Discharge: 2019-06-05 | Disposition: A | Discharge status: Home / Self Care | Payer: Medicare Other | Attending: Emergency Medicine | Admitting: Emergency Medicine

## 2019-06-05 ENCOUNTER — Emergency Department (HOSPITAL_COMMUNITY): Payer: Medicare Other

## 2019-06-05 DIAGNOSIS — Y999 Unspecified external cause status: Secondary | ICD-10-CM | POA: Diagnosis not present

## 2019-06-05 DIAGNOSIS — Z7901 Long term (current) use of anticoagulants: Secondary | ICD-10-CM | POA: Diagnosis not present

## 2019-06-05 DIAGNOSIS — I130 Hypertensive heart and chronic kidney disease with heart failure and stage 1 through stage 4 chronic kidney disease, or unspecified chronic kidney disease: Secondary | ICD-10-CM | POA: Insufficient documentation

## 2019-06-05 DIAGNOSIS — Z8673 Personal history of transient ischemic attack (TIA), and cerebral infarction without residual deficits: Secondary | ICD-10-CM | POA: Diagnosis not present

## 2019-06-05 DIAGNOSIS — S0003XA Contusion of scalp, initial encounter: Secondary | ICD-10-CM

## 2019-06-05 DIAGNOSIS — M25532 Pain in left wrist: Secondary | ICD-10-CM | POA: Diagnosis not present

## 2019-06-05 DIAGNOSIS — W1830XA Fall on same level, unspecified, initial encounter: Secondary | ICD-10-CM | POA: Insufficient documentation

## 2019-06-05 DIAGNOSIS — S62307A Unspecified fracture of fifth metacarpal bone, left hand, initial encounter for closed fracture: Secondary | ICD-10-CM | POA: Diagnosis not present

## 2019-06-05 DIAGNOSIS — Y93H2 Activity, gardening and landscaping: Secondary | ICD-10-CM | POA: Insufficient documentation

## 2019-06-05 DIAGNOSIS — M7989 Other specified soft tissue disorders: Secondary | ICD-10-CM | POA: Diagnosis not present

## 2019-06-05 DIAGNOSIS — N183 Chronic kidney disease, stage 3 unspecified: Secondary | ICD-10-CM | POA: Insufficient documentation

## 2019-06-05 DIAGNOSIS — I509 Heart failure, unspecified: Secondary | ICD-10-CM | POA: Diagnosis not present

## 2019-06-05 DIAGNOSIS — Z79899 Other long term (current) drug therapy: Secondary | ICD-10-CM | POA: Diagnosis not present

## 2019-06-05 DIAGNOSIS — Y92017 Garden or yard in single-family (private) house as the place of occurrence of the external cause: Secondary | ICD-10-CM | POA: Insufficient documentation

## 2019-06-05 NOTE — Discharge Instructions (Addendum)
Wear splint as applied until followed up by orthopedics.  The contact information for Dr. Percell Miller has been provided in this discharge summary for you to call and make these arrangements.  Return to the emergency department if symptoms significantly worsen or change.

## 2019-06-05 NOTE — ED Notes (Signed)
Patient is being discharged from the Urgent [facility] and sent to the Emergency Department via wheelchair by staff. Per Roland Rack, PA, patient is stable but in need of higher level of care due to recent fall. Patient is aware and verbalizes understanding of plan of care. There were no vitals filed for this visit.

## 2019-06-05 NOTE — ED Notes (Signed)
Patient presents to UC with complaint of head pain post fall.  Was seen at "another urgent care" but can't remember where and treated for hand injury.  Patient advised by Lysle Morales MD to go to ED for further evaluation of head injury.  Patient agreeable and has a family member taking her to [facility] ED now.

## 2019-06-05 NOTE — Progress Notes (Signed)
Orthopedic Tech Progress Note Patient Details:  Christina Austin [facility] 16-May-1934 [id number]  Ortho Devices Type of Ortho Device: Ulna gutter splint Ortho Device/Splint Interventions: Application   Post Interventions Patient Tolerated: Well   [name] 06/05/2019, 4:32 PM

## 2019-06-05 NOTE — ED Notes (Signed)
Patient states she was working in her garden on Friday, states she was bending over and fell landing on her left side. Unsure if she had loc states she doesn't remember the fall. States she was seen at [facility] walk in states she has a fractured left wrist, today when she woke up noticed increased swelling and bursing left side face with abrasions to forehead.

## 2019-06-05 NOTE — ED Provider Notes (Signed)
[facility] EMERGENCY DEPARTMENT Provider Note   CSN: [id number] Arrival date & time: 06/05/19  1317     History No chief complaint on file.   Christina Austin is a 85 y.o. female.  Patient is an 85 year old female with history of hypertension, GERD, stroke.  She presents today for evaluation of pain and swelling to her forehead.  Patient was working in her garden 2 days ago when she fell.  She injured her left wrist and struck her head on the wall.  She was seen at an outside urgent care and had x-rays performed of her wrist.  This showed what she describes to me as a "slight fracture".  I have no access to these records.  Today, she went to urgent care to have the bump on her head evaluated.  She was seen there, then referred here for further work-up.  She reports slight headache but denies any visual disturbances or neck pain.  The history is provided by the patient.       Past Medical History:  Diagnosis Date  . Anxiety   . Arthritis    legs, shoulders   . Benign essential HTN   . Chronic kidney disease, stage III (moderate)    chronic kidney disease stage 3  . Depression   . GERD (gastroesophageal reflux disease)   . History of blood transfusion    post hysterectomy  . Hypertensive heart disease without heart failure   . Memory deficit   . Neuromuscular disorder (HCC)    intermittent facial weakness - but at times its severe enough that "I have gone to the hospital", [place] state but here in [place]- seen by Dr. Loretta Plume  . Numbness of face   . Other and unspecified hyperlipidemia   . Plantar fasciitis   . Stroke [facility])    TIA  . Unspecified transient cerebral ischemia    takes plavix for 2ndary stroke prevention per Dr. Tomi Likens with neurology    Patient Active Problem List   Diagnosis Date Noted  . TIA (transient ischemic attack) 05/22/2019  . Essential hypertension 05/22/2019  . Hyperlipidemia 03/31/2016  . Hypertensive heart disease 03/31/2016  .  Personal history of transient ischemic attack (TIA), and cerebral infarction without residual deficits 03/31/2016    Past Surgical History:  Procedure Laterality Date  . BILATERAL SALPINGOOPHORECTOMY    . BREAST LUMPECTOMY WITH RADIOACTIVE SEED LOCALIZATION Right 08/02/2015   Procedure: BREAST LUMPECTOMY WITH RADIOACTIVE SEED LOCALIZATION;  Surgeon: [name], MD;  Location: [facility];  Service: General;  Laterality: Right;  . colonoscopy     removed polyps- to f/u in 6 months  . TOTAL ABDOMINAL HYSTERECTOMY    . TRIGGER FINGER RELEASE Right 10/21/2016   Procedure: RELEASE TRIGGER FINGER/A-1 PULLEY RIGHT RING FINGER;  Surgeon: Daryll Brod, MD;  Location: [place];  Service: Orthopedics;  Laterality: Right;     OB History   No obstetric history on file.     Family History  Problem Relation Age of Onset  . Cancer Mother        panceratic    Social History   Tobacco Use  . Smoking status: Never Smoker  . Smokeless tobacco: Never Used  Substance Use Topics  . Alcohol use: No  . Drug use: No    Home Medications Prior to Admission medications   Medication Sig Start Date End Date Taking? Authorizing Provider  acetaminophen (TYLENOL) 500 MG tablet Take 500 mg by mouth every 6 (six)  hours as needed for mild pain or headache.    [provider]  amLODipine (NORVASC) 10 MG tablet Take 10 mg by mouth daily with lunch.     [provider]  atorvastatin (LIPITOR) 20 MG tablet Take 1 tablet (20 mg total) by mouth daily. 05/24/19   Regalado, Belkys A, MD  buPROPion (WELLBUTRIN XL) 300 MG 24 hr tablet Take 300 mg by mouth every morning.    [provider]  Calcium Carb-Cholecalciferol (CALCIUM PLUS VITAMIN D3 PO) Take 1 tablet by mouth 2 (two) times a week.    [provider]  citalopram (CELEXA) 10 MG tablet Take 5 mg by mouth at bedtime.    [provider]  clopidogrel (PLAVIX) 75 MG tablet Take 75 mg by mouth daily.     [provider]  diclofenac Sodium (VOLTAREN) 1 % GEL Apply 2-4 g topically 4 (four) times daily as needed (for bilateral knee pain).     [provider]  Multiple Vitamin (MULTI VITAMIN PO) Take 1 tablet by mouth daily.    [provider]  omeprazole (PRILOSEC) 20 MG capsule Take 20 mg by mouth daily as needed (heartburn).    [provider]  polyethylene glycol (MIRALAX / GLYCOLAX) packet Take 17 g by mouth daily as needed for mild constipation.     [provider]  Propylene Glycol (SYSTANE COMPLETE) 0.6 % SOLN Place 1 drop into both eyes at bedtime.    [provider]  vitamin B-12 (CYANOCOBALAMIN) 100 MCG tablet Take 100 mcg by mouth daily.    [provider]    Allergies    Aspirin  Review of Systems   Review of Systems  All other systems reviewed and are negative.   Physical Exam Updated Vital Signs BP (!) 145/93 (BP Location: Right Arm)   Pulse 89   Temp (!) 89 F (31.7 C) (Oral)   Resp 16   Ht 5' 0.25" (1.53 m)   Wt 65.3 kg   SpO2 97%   BMI 27.89 kg/m   Physical Exam Vitals and nursing note reviewed.  Constitutional:      General: She is not in acute distress.    Appearance: Normal appearance. She is not ill-appearing.  HENT:     Head: Normocephalic.     Comments: There is an area of swelling and ecchymosis to the left upper forehead.  There is no palpable skull defect or other obvious abnormality.  TMs are clear. Eyes:     Extraocular Movements: Extraocular movements intact.     Pupils: Pupils are equal, round, and reactive to light.  Cardiovascular:     Rate and Rhythm: Normal rate and regular rhythm.  Pulmonary:     Effort: Pulmonary effort is normal.     Breath sounds: Normal breath sounds.  Skin:    General: Skin is warm and dry.  Neurological:     General: No focal deficit present.     Mental Status: She is alert and oriented to person, place, and time.     Cranial Nerves: No cranial nerve  deficit.     Sensory: No sensory deficit.     Motor: No weakness.     Coordination: Coordination normal.     ED Results / Procedures / Treatments   Labs (all labs ordered are listed, but only abnormal results are displayed) Labs Reviewed - No data to display  EKG None  Radiology No results found.  Procedures Procedures (including critical care time)  Medications Ordered in ED Medications - No data to display  ED Course  I have reviewed the triage vital signs and the nursing notes.  Pertinent labs & imaging results that were available during my care of the patient were reviewed by me and considered in my medical decision making (see chart for details).    MDM Rules/Calculators/A&P  Patient presenting here for evaluation of a contusion to her scalp.  She was sent by urgent care.  She fell 2 days ago while working in the garden.  She does have swelling and ecchymosis to the upper left forehead, however no palpable defect and negative head CT.  She is neurologically intact and I see no indication for additional work-up.  She also complains of ongoing pain in her left hand/wrist.  She had x-rays at urgent care 2 days ago that showed a possible 5th metacarpal fracture, however this is not apparent on today's films.  She describes her splint as being uncomfortable and request that it be removed.  A new splint will be applied.  Patient seems appropriate for discharge with outpatient follow-up.  Final Clinical Impression(s) / ED Diagnoses Final diagnoses:  None    Rx / DC Orders ED Discharge Orders    None       Veryl Speak, MD 06/05/19 1523

## 2019-06-05 NOTE — ED Triage Notes (Signed)
Patient fell on Friday and seen at [facility] and has fractured wrist. Now complains of ongoing swelling to left side of face, no loc. Ambulatory with steady gait, NAD

## 2019-06-08 DIAGNOSIS — M25532 Pain in left wrist: Secondary | ICD-10-CM | POA: Diagnosis not present

## 2019-06-09 DIAGNOSIS — M130 Polyarthritis, unspecified: Secondary | ICD-10-CM | POA: Diagnosis not present

## 2019-06-09 DIAGNOSIS — I10 Essential (primary) hypertension: Secondary | ICD-10-CM | POA: Diagnosis not present

## 2019-06-09 DIAGNOSIS — E785 Hyperlipidemia, unspecified: Secondary | ICD-10-CM | POA: Diagnosis not present

## 2019-06-09 NOTE — Progress Notes (Signed)
NEUROLOGY CONSULTATION NOTE  Christina Austin MRN: [id number] DOB: 17-Mar-1934  Referring provider: Burnetta Sabin, NP Primary care provider: Lucianne Lei, MD  Reason for consult:  TIA  HISTORY OF PRESENT ILLNESS: Christina Austin is an 85 year old right-handed female with CKD stage III, HTN, and HLD who presents for TIA.  History supplemented by hospital and referring provider's notes.  She is accompanied by her daughter who also supplements history.  CT brain, MRI brain and MRA of head and neck personally reviewed.  She's had these episodes for over 10 years. She will describe onset of right facial numbness. Sometimes it is briefly associated with right facial weakness as well. It can occur anywhere from one hour to an entire day. She cannot really tell me how frequent she gets them. However, she tends to have periods when they become more frequent. On rare occasions, it has been associated with headache, described as right-sided, and pressure-like. It was not associated with nausea, photophobia, or phonophobia. She has no prior history is of migraine. She has seen other neurologists in the past and has had an unremarkable workup. Most recently, she had no workup performed at the [facility] clinic.  She was admitted to the [facility] on 11/03/12 for what was diagnosed as a TIA.  She had developed sensation of right sided facial warmth, then right-sided facial droop and right upper extremity weakness, lasting less than 5 minutes.  Work up included:  CT Head:  chronic left internal capsule lacunar infarct, but no acute abnormalities; MRI Brain w/o:  small vessel ischemic changes.  No acute intracranial abnormalities; MRA Head & Neck:  No intracranial or extracranial stenosis; 2D Echo:  EF 64 +/- 65%, no source of embolus; LABS:  Hgb A1c 6.0, est avg glucose 126, LDL 54, INR 1.0; EEG:  intermittent slowing in the left temporal region, "suggestive of a cortical dysfunction in the left temporal region.  No  epileptiform activity or EEG seizures were noted."; EKG:  no ST/T changes.   In 2015, they had been occurring almost every day. She also noted some possible weakness or heaviness in the right arm. She notes a soreness in the right side of her neck and into the shoulder.  Over the next few years, she has had subsequent similar episodes, at least 3.    She was admitted to [facility] on 05/22/2019 for right sided upper and lower extremity weakness with right-sided headache.  Symptoms lasted maybe 5 minutes.  Blood pressure was 210/100.  CT of head showed age indeterminate basal ganglia lacunar infarct.  Follow up MRI of brain showed old bilateral internal capsule/basal ganglia lacunar infarcts but no acute intracranial abnormality.  MRA of head showed multifocal intracranial stenosis including moderate right cavernous ICA stenosis, mild left ICA distal cavernous/paraclinoid stenosis, high-grade prioxmal bilateral MCA M2 stenosis, high-grade right PCA P2 stenosis, moderate left PCA P2 stenoses and severe left PCA P3 stenosis.  MRA of neck showed narrowing at origin of right ICA noted, possibly exceeding 70%.  However, follow up carotid doppler showed bilateral 1-39% ICA stenosis with antegrade flow of vertebral arteries.  2D echo showed EF 70-75% with no cardiac source of embolus.  LDL 92.  Hgb A1c 6.2.  She was continued on Plavix 75mg  daily due to history of GI bleed on ASA.  Lipitor 10mg  was increased to 20mg .  She continues to have right dull frontal headache.    05/22/2019 CT HEAD WO:  1. 10 mm age-indeterminate left  internal capsule/basal ganglia lacunar infarct. This may be acute. Consider brain MRI for further evaluation.  2. No acute intracranial hemorrhage or visible cortical infarct.  3. Small chronic lacunar infarct within the posterior limb of left internal capsule. Mild background chronic small vessel ischemic changes within the cerebral white matter.  4. Mild ethmoid sinus mucosal  thickening. 05/22/2019 MRI BRAIN WO:  1. No evidence of acute intracranial abnormality, including acute Infarction.  2. Chronic lacunar infarcts within the bilateral internal capsules/basal ganglia. Specifically, the age-indeterminate lacunar infarct described on earlier head CT is chronic.  3. Background moderate chronic small vessel ischemic changes within the cerebral white matter. 4. Mild ethmoid sinus mucosal thickening. 05/22/2019 MRA HEAD:  1. Examination limited by motion degradation.  2. Intracranial atherosclerotic disease with multifocal stenoses, most notably as follows.  3. Moderate atherosclerotic narrowing of the cavernous right ICA.  4. Mild/moderate narrowing of the distal cavernous/paraclinoid left ICA.  5. There are apparent high-grade stenoses within multiple proximal M2 MCA branch vessels bilaterally. Some of these stenoses may be accentuated by motion artifact.  6. High-grade stenosis within the right posterior cerebral artery at the P2-P3 junction.  7. Multifocal moderate stenoses within the P2 left PCA. Multiple moderate to moderately severe stenoses within the P3 and more distal left PCA. 05/22/2019 MRA NECK:  Examination limited by motion degradation and noncontrast technique.  This precludes adequate evaluation for stenoses within the distal innominate artery, proximal subclavian arteries, proximal common carotid arteries and proximal vertebral arteries.  The common and internal carotid arteries are patent within the neck with antegrade flow. There is narrowing at the origin of the right ICA which may approach or exceed 70%. Carotid artery duplex is recommended for further evaluation. No hemodynamically significant stenosis of the cervical left ICA.  The vertebral arteries are patent within the neck bilaterally with antegrade flow. Probable stenoses at the origin of the right vertebral artery and within the proximal V1 left vertebral artery which are poorly quantified for reasons  described. No appreciable significant stenosis more distally within the imaged cervical vertebral arteries.  PAST MEDICAL HISTORY: Past Medical History:  Diagnosis Date  . Anxiety   . Arthritis    legs, shoulders   . Benign essential HTN   . Chronic kidney disease, stage III (moderate)    chronic kidney disease stage 3  . Depression   . GERD (gastroesophageal reflux disease)   . History of blood transfusion    post hysterectomy  . Hypertensive heart disease without heart failure   . Memory deficit   . Neuromuscular disorder (HCC)    intermittent facial weakness - but at times its severe enough that "I have gone to the hospital", [place] but [place] in [place]- seen by Dr. Loretta Plume  . Numbness of face   . Other and unspecified hyperlipidemia   . Plantar fasciitis   . Stroke [facility])    TIA  . Unspecified transient cerebral ischemia    takes plavix for 2ndary stroke prevention per Dr. Tomi Likens with neurology    PAST SURGICAL HISTORY: Past Surgical History:  Procedure Laterality Date  . BILATERAL SALPINGOOPHORECTOMY    . BREAST LUMPECTOMY WITH RADIOACTIVE SEED LOCALIZATION Right 08/02/2015   Procedure: BREAST LUMPECTOMY WITH RADIOACTIVE SEED LOCALIZATION;  Surgeon: [name], MD;  Location: [facility];  Service: General;  Laterality: Right;  . colonoscopy     removed polyps- to f/u in 6 months  . TOTAL ABDOMINAL HYSTERECTOMY    . TRIGGER FINGER RELEASE Right 10/21/2016  Procedure: RELEASE TRIGGER FINGER/A-1 PULLEY RIGHT RING FINGER;  Surgeon: Daryll Brod, MD;  Location: [facility];  Service: Orthopedics;  Laterality: Right;    MEDICATIONS: Current Outpatient Medications on File Prior to Visit  Medication Sig Dispense Refill  . acetaminophen (TYLENOL) 500 MG tablet Take 500 mg by mouth every 6 (six) hours as needed for mild pain or headache.    Marland Kitchen amLODipine (NORVASC) 10 MG tablet Take 10 mg by mouth daily with lunch.     Marland Kitchen atorvastatin (LIPITOR) 20 MG tablet Take 1  tablet (20 mg total) by mouth daily. 30 tablet 3  . buPROPion (WELLBUTRIN XL) 300 MG 24 hr tablet Take 300 mg by mouth every morning.    . Calcium Carb-Cholecalciferol (CALCIUM PLUS VITAMIN D3 PO) Take 1 tablet by mouth 2 (two) times a week.    . citalopram (CELEXA) 10 MG tablet Take 5 mg by mouth at bedtime.    . clopidogrel (PLAVIX) 75 MG tablet Take 75 mg by mouth daily.    . diclofenac Sodium (VOLTAREN) 1 % GEL Apply 2-4 g topically 4 (four) times daily as needed (for bilateral knee pain).     . Multiple Vitamin (MULTI VITAMIN PO) Take 1 tablet by mouth daily.    Marland Kitchen omeprazole (PRILOSEC) 20 MG capsule Take 20 mg by mouth daily as needed (heartburn).    . polyethylene glycol (MIRALAX / GLYCOLAX) packet Take 17 g by mouth daily as needed for mild constipation.     Marland Kitchen Propylene Glycol (SYSTANE COMPLETE) 0.6 % SOLN Place 1 drop into both eyes at bedtime.    . vitamin B-12 (CYANOCOBALAMIN) 100 MCG tablet Take 100 mcg by mouth daily.     No current facility-administered medications on file prior to visit.    ALLERGIES: Allergies  Allergen Reactions  . Aspirin Other (See Comments)    GI Bleeding- Large amounts cause internal bleeding     FAMILY HISTORY: Family History  Problem Relation Age of Onset  . Cancer Mother        panceratic    SOCIAL HISTORY: Social History   Socioeconomic History  . Marital status: Widowed    Spouse name: Not on file  . Number of children: Not on file  . Years of education: Not on file  . Highest education level: Not on file  Occupational History  . Not on file  Tobacco Use  . Smoking status: Never Smoker  . Smokeless tobacco: Never Used  Substance and Sexual Activity  . Alcohol use: No  . Drug use: No  . Sexual activity: Not on file  Other Topics Concern  . Not on file  Social History Narrative  . Not on file   Social Determinants of Health   Financial Resource Strain:   . Difficulty of Paying Living Expenses:   Food Insecurity:   .  Worried About Charity fundraiser in the Last Year:   . Arboriculturist in the Last Year:   Transportation Needs:   . Film/video editor (Medical):   Marland Kitchen Lack of Transportation (Non-Medical):   Physical Activity:   . Days of Exercise per Week:   . Minutes of Exercise per Session:   Stress:   . Feeling of Stress :   Social Connections:   . Frequency of Communication with Friends and Family:   . Frequency of Social Gatherings with Friends and Family:   . Attends Religious Services:   . Active Member of Clubs or Organizations:   .  Attends Archivist Meetings:   Marland Kitchen Marital Status:   Intimate Partner Violence:   . Fear of Current or Ex-Partner:   . Emotionally Abused:   Marland Kitchen Physically Abused:   . Sexually Abused:    PHYSICAL EXAM: Blood pressure (!) 149/87, pulse 75, height 5\' 2"  (1.575 m), weight 147 lb 3.2 oz (66.8 kg), SpO2 97 %. General: No acute distress.  Patient appears well-groomed.   Head:  Normocephalic/atraumatic Eyes:  fundi examined but not visualized Neck: supple, no paraspinal tenderness, full range of motion Back: No paraspinal tenderness Heart: regular rate and rhythm Lungs: Clear to auscultation bilaterally. Vascular: No carotid bruits. Neurological Exam: Mental status: alert and oriented to person, place, and time, recent and remote memory intact, fund of knowledge intact, attention and concentration intact, speech fluent and not dysarthric, language intact. Cranial nerves: CN I: not tested CN II: pupils equal, round and reactive to light, visual fields intact CN III, IV, VI:  full range of motion, no nystagmus, no ptosis CN V: facial sensation intact CN VII: upper and lower face symmetric CN VIII: hearing intact CN IX, X: gag intact, uvula midline CN XI: sternocleidomastoid and trapezius muscles intact CN XII: tongue midline Bulk & Tone: normal, no fasciculations. Motor:  5/5 throughout  Sensation:  Pinprick and vibration sensation intact.  Deep  Tendon Reflexes:  2+ throughout, toes downgoing.  Finger to nose testing:  Without dysmetria.   Heel to shin:  Without dysmetria.   Gait:  Normal station and stride.  Romberg negative.  IMPRESSION: 1.  Left hemispheric transient ischemic attack in setting of multifocal intracranial stenosis vs hemiplegic migraine (recurrent habitual spells over the years) 2.  Hypertension 3.  Hyperlipidemia 4.  Headache  PLAN: 1.  Plavix 75mg  daily for secondary stroke prevention. 2.  Lipitor 20mg  daily (LDL goal less than 70).  Repeat lipid panel in 3 months 3.  Blood pressure control (due to intracranial stenosis, systolic blood pressure goal 130-150) 4.  For headache, start gabapentin 100mg  at bedtime.  We can increase to 200mg  at bedtime in 4 weeks if needed. 5.  Mediterranean diet 6.  Follow up in 4 months.  Thank you for allowing me to take part in the care of this patient.  Metta Clines, DO  CC:  Lucianne Lei, MD  Burnetta Sabin, NP

## 2019-06-10 ENCOUNTER — Other Ambulatory Visit: Payer: Self-pay

## 2019-06-10 ENCOUNTER — Encounter: Payer: Self-pay | Admitting: Neurology

## 2019-06-10 ENCOUNTER — Ambulatory Visit (INDEPENDENT_AMBULATORY_CARE_PROVIDER_SITE_OTHER): Payer: Medicare Other | Admitting: Neurology

## 2019-06-10 VITALS — BP 149/87 | HR 75 | Ht 62.0 in | Wt 147.2 lb

## 2019-06-10 DIAGNOSIS — I10 Essential (primary) hypertension: Secondary | ICD-10-CM

## 2019-06-10 DIAGNOSIS — R519 Headache, unspecified: Secondary | ICD-10-CM | POA: Diagnosis not present

## 2019-06-10 DIAGNOSIS — G459 Transient cerebral ischemic attack, unspecified: Secondary | ICD-10-CM | POA: Diagnosis not present

## 2019-06-10 DIAGNOSIS — E785 Hyperlipidemia, unspecified: Secondary | ICD-10-CM

## 2019-06-10 DIAGNOSIS — I6523 Occlusion and stenosis of bilateral carotid arteries: Secondary | ICD-10-CM | POA: Diagnosis not present

## 2019-06-10 MED ORDER — GABAPENTIN 100 MG PO CAPS: 100.0000 mg | ORAL_CAPSULE | Freq: Every day | ORAL | 5 refills | Status: AC

## 2019-06-10 NOTE — Patient Instructions (Signed)
1.  Continue clopidogrel 75mg  daily 2.  Continue atorvastatin 20mg  daily.  Repeat lipid panel in 3 months. 3.  Continue blood pressure control (systolic blood pressure goal 130-150) 4.  Mediterranean diet (see below) 5.  For headache, take gabapentin 100mg  at bedtime.  We can increase dose in 1 month if needed. 6.  Follow up in 4 months.   Mediterranean Diet A Mediterranean diet refers to food and lifestyle choices that are based on the traditions of countries located on the [employer]. This way of eating has been shown to help prevent certain conditions and improve outcomes for people who have chronic diseases, like kidney disease and heart disease. What are tips for following this plan? Lifestyle  Cook and eat meals together with your family, when possible.  Drink enough fluid to keep your urine clear or pale yellow.  Be physically active every day. This includes: ? Aerobic exercise like running or swimming. ? Leisure activities like gardening, walking, or housework.  Get 7-8 hours of sleep each night.  If recommended by your health care provider, drink red wine in moderation. This means 1 glass a day for nonpregnant women and 2 glasses a day for men. A glass of wine equals 5 oz (150 mL). Reading food labels   Check the serving size of packaged foods. For foods such as rice and pasta, the serving size refers to the amount of cooked product, not dry.  Check the total fat in packaged foods. Avoid foods that have saturated fat or trans fats.  Check the ingredients list for added sugars, such as corn syrup. Shopping  At the grocery store, buy most of your food from the areas near the walls of the store. This includes: ? Fresh fruits and vegetables (produce). ? Grains, beans, nuts, and seeds. Some of these may be available in unpackaged forms or large amounts (in bulk). ? Fresh seafood. ? Poultry and eggs. ? Low-fat dairy products.  Buy whole ingredients instead of  prepackaged foods.  Buy fresh fruits and vegetables in-season from local farmers markets.  Buy frozen fruits and vegetables in resealable bags.  If you do not have access to quality fresh seafood, buy precooked frozen shrimp or canned fish, such as tuna, salmon, or sardines.  Buy small amounts of raw or cooked vegetables, salads, or olives from the deli or salad bar at your store.  Stock your pantry so you always have certain foods on hand, such as olive oil, canned tuna, canned tomatoes, rice, pasta, and beans. Cooking  Cook foods with extra-virgin olive oil instead of using butter or other vegetable oils.  Have meat as a side dish, and have vegetables or grains as your main dish. This means having meat in small portions or adding small amounts of meat to foods like pasta or stew.  Use beans or vegetables instead of meat in common dishes like chili or lasagna.  Experiment with different cooking methods. Try roasting or broiling vegetables instead of steaming or sauteing them.  Add frozen vegetables to soups, stews, pasta, or rice.  Add nuts or seeds for added healthy fat at each meal. You can add these to yogurt, salads, or vegetable dishes.  Marinate fish or vegetables using olive oil, lemon juice, garlic, and fresh herbs. Meal planning   Plan to eat 1 vegetarian meal one day each week. Try to work up to 2 vegetarian meals, if possible.  Eat seafood 2 or more times a week.  Have healthy snacks readily available,  such as: ? Vegetable sticks with hummus. ? [place] yogurt. ? Fruit and nut trail mix.  Eat balanced meals throughout the week. This includes: ? Fruit: 2-3 servings a day ? Vegetables: 4-5 servings a day ? Low-fat dairy: 2 servings a day ? Fish, poultry, or lean meat: 1 serving a day ? Beans and legumes: 2 or more servings a week ? Nuts and seeds: 1-2 servings a day ? Whole grains: 6-8 servings a day ? Extra-virgin olive oil: 3-4 servings a day  Limit red meat  and sweets to only a few servings a month What are my food choices?  Mediterranean diet ? Recommended  Grains: Whole-grain pasta. Brown rice. Bulgar wheat. Polenta. Couscous. Whole-wheat bread. [place].  Vegetables: Artichokes. Beets. Broccoli. Cabbage. Carrots. Eggplant. Green beans. Chard. Kale. Spinach. Onions. Leeks. Peas. Squash. Tomatoes. Peppers. Radishes.  Fruits: Apples. Apricots. Avocado. Berries. Bananas. Cherries. Dates. Figs. Grapes. Lemons. Melon. Oranges. Peaches. Plums. Pomegranate.  Meats and other protein foods: Beans. Almonds. Sunflower seeds. Pine nuts. Peanuts. [place]. Salmon. Scallops. Shrimp. [place]. Tilapia. Clams. Oysters. Eggs.  Dairy: Low-fat milk. Cheese. Greek yogurt.  Beverages: Water. Red wine. Herbal tea.  Fats and oils: Extra virgin olive oil. Avocado oil. Grape seed oil.  Sweets and desserts: [place] yogurt with honey. Baked apples. Poached pears. Trail mix.  Seasoning and other foods: Basil. Cilantro. Coriander. Cumin. Mint. Parsley. Sage. Rosemary. Tarragon. Garlic. Oregano. Thyme. Pepper. Balsalmic vinegar. Tahini. Hummus. Tomato sauce. Olives. Mushrooms. ? Limit these  Grains: Prepackaged pasta or rice dishes. Prepackaged cereal with added sugar.  Vegetables: Deep fried potatoes (french fries).  Fruits: Fruit canned in syrup.  Meats and other protein foods: Beef. Pork. Lamb. Poultry with skin. Hot dogs. Berniece Salines.  Dairy: Ice cream. Sour cream. Whole milk.  Beverages: Juice. Sugar-sweetened soft drinks. Beer. Liquor and spirits.  Fats and oils: Butter. Canola oil. Vegetable oil. Beef fat (tallow). Lard.  Sweets and desserts: Cookies. Cakes. Pies. Candy.  Seasoning and other foods: Mayonnaise. Premade sauces and marinades. The items listed may not be a complete list. Talk with your dietitian about what dietary choices are right for you. Summary  The Mediterranean diet includes both food and lifestyle choices.  Eat a variety of fresh  fruits and vegetables, beans, nuts, seeds, and whole grains.  Limit the amount of red meat and sweets that you eat.  Talk with your health care provider about whether it is safe for you to drink red wine in moderation. This means 1 glass a day for nonpregnant women and 2 glasses a day for men. A glass of wine equals 5 oz (150 mL). This information is not intended to replace advice given to you by your health care provider. Make sure you discuss any questions you have with your health care provider. Document Revised: 08/30/2015 Document Reviewed: 08/23/2015 Elsevier Patient Education  [place].

## 2019-06-29 DIAGNOSIS — S62307A Unspecified fracture of fifth metacarpal bone, left hand, initial encounter for closed fracture: Secondary | ICD-10-CM | POA: Diagnosis not present

## 2019-07-11 DIAGNOSIS — S62307D Unspecified fracture of fifth metacarpal bone, left hand, subsequent encounter for fracture with routine healing: Secondary | ICD-10-CM | POA: Diagnosis not present

## 2019-07-12 ENCOUNTER — Ambulatory Visit: Payer: Medicare Other | Admitting: Psychology

## 2019-07-13 ENCOUNTER — Ambulatory Visit: Payer: Medicare Other | Admitting: Psychology

## 2019-08-19 DIAGNOSIS — M79641 Pain in right hand: Secondary | ICD-10-CM | POA: Diagnosis not present

## 2019-08-19 DIAGNOSIS — M79642 Pain in left hand: Secondary | ICD-10-CM | POA: Diagnosis not present

## 2019-08-31 DIAGNOSIS — M25631 Stiffness of right wrist, not elsewhere classified: Secondary | ICD-10-CM | POA: Diagnosis not present

## 2019-08-31 DIAGNOSIS — M79642 Pain in left hand: Secondary | ICD-10-CM | POA: Diagnosis not present

## 2019-08-31 DIAGNOSIS — M25632 Stiffness of left wrist, not elsewhere classified: Secondary | ICD-10-CM | POA: Diagnosis not present

## 2019-08-31 DIAGNOSIS — M79641 Pain in right hand: Secondary | ICD-10-CM | POA: Diagnosis not present

## 2019-09-08 DIAGNOSIS — M79641 Pain in right hand: Secondary | ICD-10-CM | POA: Diagnosis not present

## 2019-09-08 DIAGNOSIS — M79642 Pain in left hand: Secondary | ICD-10-CM | POA: Diagnosis not present

## 2019-09-08 DIAGNOSIS — M25632 Stiffness of left wrist, not elsewhere classified: Secondary | ICD-10-CM | POA: Diagnosis not present

## 2019-09-08 DIAGNOSIS — M25631 Stiffness of right wrist, not elsewhere classified: Secondary | ICD-10-CM | POA: Diagnosis not present

## 2019-09-26 DIAGNOSIS — M79641 Pain in right hand: Secondary | ICD-10-CM | POA: Diagnosis not present

## 2019-09-26 DIAGNOSIS — M79642 Pain in left hand: Secondary | ICD-10-CM | POA: Diagnosis not present

## 2019-09-26 DIAGNOSIS — M25631 Stiffness of right wrist, not elsewhere classified: Secondary | ICD-10-CM | POA: Diagnosis not present

## 2019-09-26 DIAGNOSIS — M25632 Stiffness of left wrist, not elsewhere classified: Secondary | ICD-10-CM | POA: Diagnosis not present

## 2019-09-30 DIAGNOSIS — M65342 Trigger finger, left ring finger: Secondary | ICD-10-CM | POA: Diagnosis not present

## 2019-10-05 DIAGNOSIS — M79641 Pain in right hand: Secondary | ICD-10-CM | POA: Diagnosis not present

## 2019-10-05 DIAGNOSIS — M25632 Stiffness of left wrist, not elsewhere classified: Secondary | ICD-10-CM | POA: Diagnosis not present

## 2019-10-05 DIAGNOSIS — M25631 Stiffness of right wrist, not elsewhere classified: Secondary | ICD-10-CM | POA: Diagnosis not present

## 2019-10-05 DIAGNOSIS — M79642 Pain in left hand: Secondary | ICD-10-CM | POA: Diagnosis not present

## 2019-10-27 DIAGNOSIS — M25632 Stiffness of left wrist, not elsewhere classified: Secondary | ICD-10-CM | POA: Diagnosis not present

## 2019-10-27 DIAGNOSIS — M25631 Stiffness of right wrist, not elsewhere classified: Secondary | ICD-10-CM | POA: Diagnosis not present

## 2019-10-27 DIAGNOSIS — M79642 Pain in left hand: Secondary | ICD-10-CM | POA: Diagnosis not present

## 2019-10-27 DIAGNOSIS — M79641 Pain in right hand: Secondary | ICD-10-CM | POA: Diagnosis not present

## 2019-11-23 DIAGNOSIS — D320 Benign neoplasm of cerebral meninges: Secondary | ICD-10-CM | POA: Diagnosis not present

## 2019-11-23 DIAGNOSIS — M50223 Other cervical disc displacement at C6-C7 level: Secondary | ICD-10-CM | POA: Diagnosis not present

## 2019-11-23 DIAGNOSIS — I10 Essential (primary) hypertension: Secondary | ICD-10-CM | POA: Diagnosis not present

## 2019-11-23 DIAGNOSIS — E785 Hyperlipidemia, unspecified: Secondary | ICD-10-CM | POA: Diagnosis not present

## 2019-11-23 DIAGNOSIS — I129 Hypertensive chronic kidney disease with stage 1 through stage 4 chronic kidney disease, or unspecified chronic kidney disease: Secondary | ICD-10-CM | POA: Diagnosis not present

## 2019-11-23 DIAGNOSIS — M5030 Other cervical disc degeneration, unspecified cervical region: Secondary | ICD-10-CM | POA: Diagnosis not present

## 2019-11-23 DIAGNOSIS — I639 Cerebral infarction, unspecified: Secondary | ICD-10-CM | POA: Diagnosis not present

## 2019-11-23 DIAGNOSIS — Z7982 Long term (current) use of aspirin: Secondary | ICD-10-CM | POA: Diagnosis not present

## 2019-11-23 DIAGNOSIS — R220 Localized swelling, mass and lump, head: Secondary | ICD-10-CM | POA: Diagnosis not present

## 2019-11-23 DIAGNOSIS — N189 Chronic kidney disease, unspecified: Secondary | ICD-10-CM | POA: Diagnosis not present

## 2019-11-23 DIAGNOSIS — R2990 Unspecified symptoms and signs involving the nervous system: Secondary | ICD-10-CM | POA: Diagnosis not present

## 2019-11-23 DIAGNOSIS — G4485 Primary stabbing headache: Secondary | ICD-10-CM | POA: Diagnosis not present

## 2019-11-23 DIAGNOSIS — I6602 Occlusion and stenosis of left middle cerebral artery: Secondary | ICD-10-CM | POA: Diagnosis not present

## 2019-11-23 DIAGNOSIS — R200 Anesthesia of skin: Secondary | ICD-10-CM | POA: Diagnosis not present

## 2019-11-23 DIAGNOSIS — Z7902 Long term (current) use of antithrombotics/antiplatelets: Secondary | ICD-10-CM | POA: Diagnosis not present

## 2019-11-23 DIAGNOSIS — I6622 Occlusion and stenosis of left posterior cerebral artery: Secondary | ICD-10-CM | POA: Diagnosis not present

## 2019-11-23 DIAGNOSIS — R569 Unspecified convulsions: Secondary | ICD-10-CM | POA: Diagnosis not present

## 2019-11-23 DIAGNOSIS — Q283 Other malformations of cerebral vessels: Secondary | ICD-10-CM | POA: Diagnosis not present

## 2019-11-23 DIAGNOSIS — G9389 Other specified disorders of brain: Secondary | ICD-10-CM | POA: Diagnosis not present

## 2019-12-06 ENCOUNTER — Ambulatory Visit: Payer: Medicare Other | Admitting: Neurology

## 2019-12-06 ENCOUNTER — Telehealth: Payer: Self-pay | Admitting: Neurology

## 2019-12-06 NOTE — Telephone Encounter (Signed)
She was supposed to have a follow up appointment today but it was previously cancelled.  She needs to make a follow up appointment.  Within 4 weeks is fine.

## 2019-12-06 NOTE — Telephone Encounter (Signed)
Patient called in and left a message stating she was recently admitted for a few days to [facility] in [place], [place]. She was having numbness in her face.

## 2019-12-06 NOTE — Telephone Encounter (Signed)
Called the patient to try and get her scheduled and had to leave a message on her machine.

## 2019-12-07 DIAGNOSIS — I10 Essential (primary) hypertension: Secondary | ICD-10-CM | POA: Diagnosis not present

## 2019-12-07 DIAGNOSIS — R200 Anesthesia of skin: Secondary | ICD-10-CM | POA: Diagnosis not present

## 2019-12-13 DIAGNOSIS — M65332 Trigger finger, left middle finger: Secondary | ICD-10-CM | POA: Diagnosis not present

## 2019-12-19 DIAGNOSIS — R413 Other amnesia: Secondary | ICD-10-CM | POA: Diagnosis not present

## 2019-12-19 DIAGNOSIS — R064 Hyperventilation: Secondary | ICD-10-CM | POA: Diagnosis not present

## 2019-12-19 DIAGNOSIS — D171 Benign lipomatous neoplasm of skin and subcutaneous tissue of trunk: Secondary | ICD-10-CM | POA: Diagnosis not present

## 2019-12-19 DIAGNOSIS — G459 Transient cerebral ischemic attack, unspecified: Secondary | ICD-10-CM | POA: Diagnosis not present

## 2019-12-19 DIAGNOSIS — E785 Hyperlipidemia, unspecified: Secondary | ICD-10-CM | POA: Diagnosis not present

## 2019-12-19 DIAGNOSIS — M130 Polyarthritis, unspecified: Secondary | ICD-10-CM | POA: Diagnosis not present

## 2019-12-19 DIAGNOSIS — I10 Essential (primary) hypertension: Secondary | ICD-10-CM | POA: Diagnosis not present

## 2020-01-17 DIAGNOSIS — N8110 Cystocele, unspecified: Secondary | ICD-10-CM | POA: Diagnosis not present

## 2020-01-17 DIAGNOSIS — K5909 Other constipation: Secondary | ICD-10-CM | POA: Diagnosis not present

## 2020-04-20 DIAGNOSIS — E785 Hyperlipidemia, unspecified: Secondary | ICD-10-CM | POA: Diagnosis not present

## 2020-04-20 DIAGNOSIS — I1311 Hypertensive heart and chronic kidney disease without heart failure, with stage 5 chronic kidney disease, or end stage renal disease: Secondary | ICD-10-CM | POA: Diagnosis not present

## 2020-04-20 DIAGNOSIS — E782 Mixed hyperlipidemia: Secondary | ICD-10-CM | POA: Diagnosis not present

## 2020-04-20 DIAGNOSIS — R413 Other amnesia: Secondary | ICD-10-CM | POA: Diagnosis not present

## 2020-04-20 DIAGNOSIS — N189 Chronic kidney disease, unspecified: Secondary | ICD-10-CM | POA: Diagnosis not present

## 2020-04-20 DIAGNOSIS — I10 Essential (primary) hypertension: Secondary | ICD-10-CM | POA: Diagnosis not present

## 2020-04-20 DIAGNOSIS — D179 Benign lipomatous neoplasm, unspecified: Secondary | ICD-10-CM | POA: Diagnosis not present

## 2020-04-20 DIAGNOSIS — H903 Sensorineural hearing loss, bilateral: Secondary | ICD-10-CM | POA: Diagnosis not present

## 2020-05-10 DIAGNOSIS — I1311 Hypertensive heart and chronic kidney disease without heart failure, with stage 5 chronic kidney disease, or end stage renal disease: Secondary | ICD-10-CM | POA: Diagnosis not present

## 2020-05-10 DIAGNOSIS — Z0001 Encounter for general adult medical examination with abnormal findings: Secondary | ICD-10-CM | POA: Diagnosis not present

## 2020-05-10 DIAGNOSIS — M130 Polyarthritis, unspecified: Secondary | ICD-10-CM | POA: Diagnosis not present

## 2020-05-10 DIAGNOSIS — H903 Sensorineural hearing loss, bilateral: Secondary | ICD-10-CM | POA: Diagnosis not present

## 2021-06-04 IMAGING — MR MR HEAD W/O CM
12 of 13 series · 44 of 48 positions shown · non-contrast
Comparison: Noncontrast head CT performed earlier the same day
05/22/2019.

CLINICAL DATA: Neuro deficit, acute, stroke suspected.

EXAM:
MRI HEAD WITHOUT CONTRAST
MRA HEAD WITHOUT CONTRAST
TECHNIQUE: Multiplanar, multiecho pulse sequences of the brain and surrounding
structures were obtained without intravenous contrast. Angiographic
images of the head were obtained using MRA technique without
contrast.

[Series 5: DWI · axial · 3.0mm · 0.88mm/px · z∈[-83,+39]mm · 8 of 84 slices shown (1 of 4)]
[im 1/84]
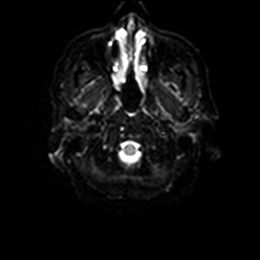
[im 12/84]
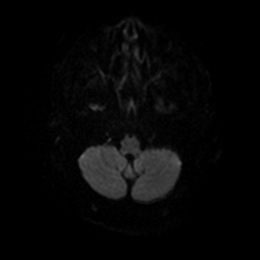
[im 24/84]
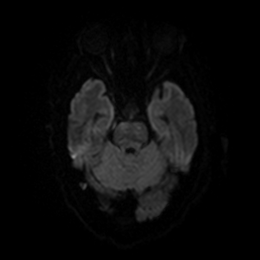
[im 36/84]
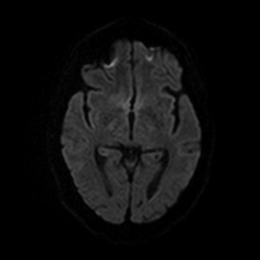
[im 48/84]
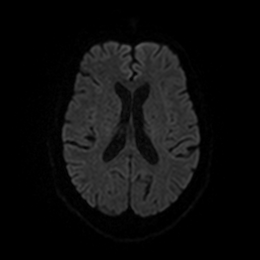
[im 60/84]
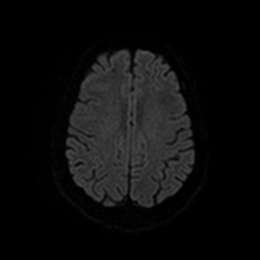
[im 72/84]
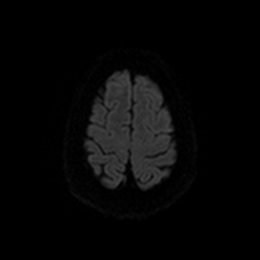
[im 84/84]
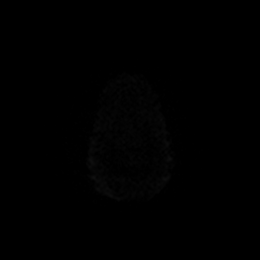

[Series 6: DWI · axial · 3.0mm · 0.88mm/px · z∈[-83,+39]mm · 3 of 42 slices shown (2 of 4)]
[im 1/42]
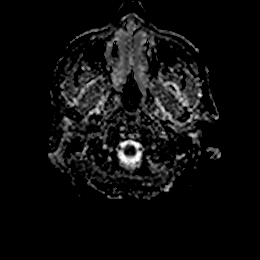
[im 21/42]
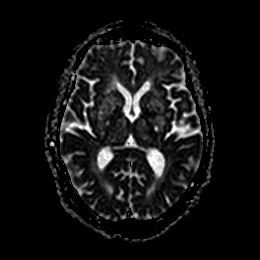
[im 42/42]
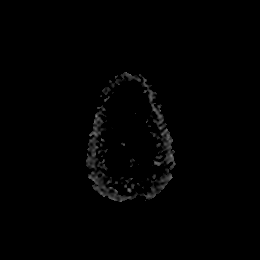

[Series 7: DWI · coronal · 4.0mm · 0.88mm/px · 6 of 68 slices shown (3 of 4)]
[im 1/68]
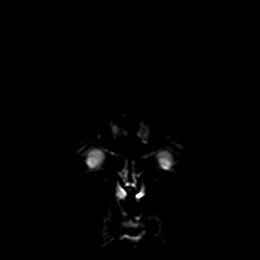
[im 14/68]
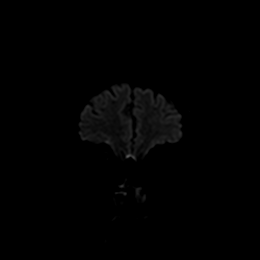
[im 27/68]
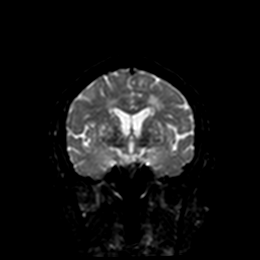
[im 41/68]
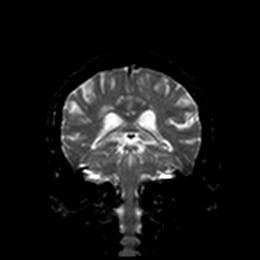
[im 54/68]
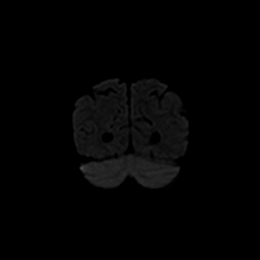
[im 68/68]
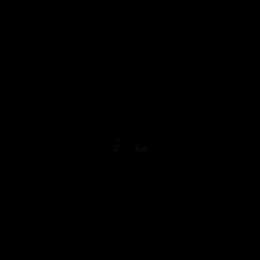

[Series 8: DWI · coronal · 4.0mm · 0.88mm/px · 3 of 34 slices shown (4 of 4)]
[im 1/34]
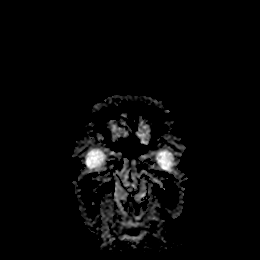
[im 17/34]
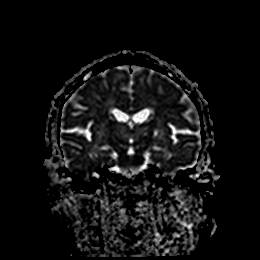
[im 34/34]
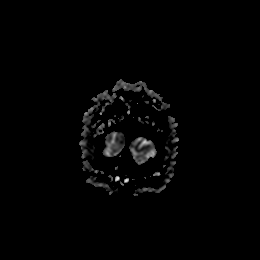

[Series 9: T1 · sagittal · 5.0mm · 0.75mm/px · 2 of 23 slices shown]
[im 1/23]
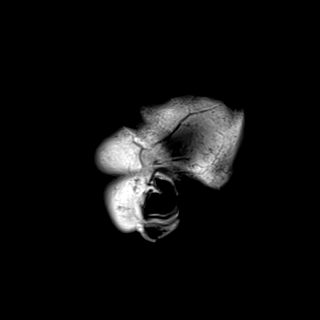
[im 23/23]
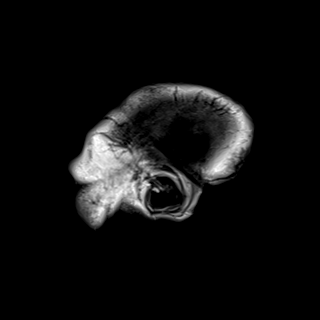

[Series 10: T2 · axial · 5.0mm · 0.72mm/px · z∈[-88,+44]mm · 2 of 23 slices shown (1 of 2)]
[im 1/23]
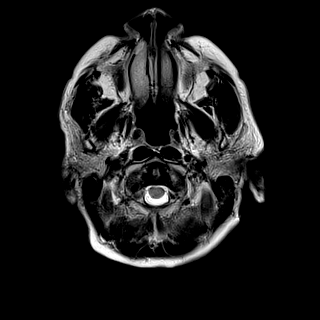
[im 23/23]
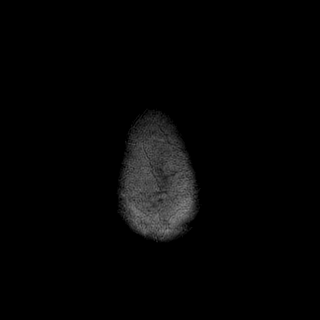

[Series 11: FLAIR · axial · 5.0mm · 0.45mm/px · z∈[-89,+43]mm · 2 of 23 slices shown]
[im 1/23]
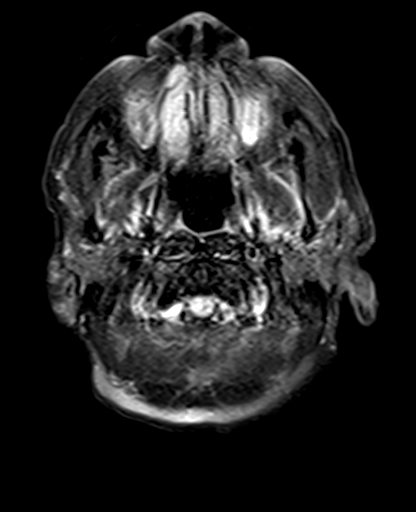
[im 23/23]
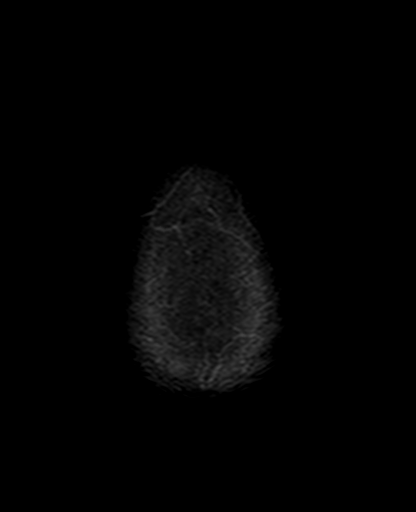

[Series 12: mag_images · axial · 3.0mm · 0.90mm/px · z∈[-99,+54]mm · 4 of 52 slices shown]
[im 1/52]
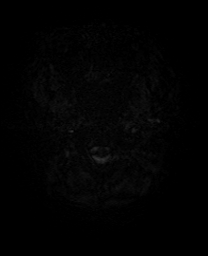
[im 18/52]
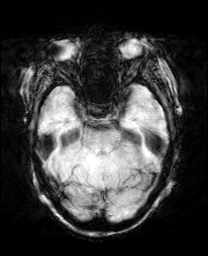
[im 35/52]
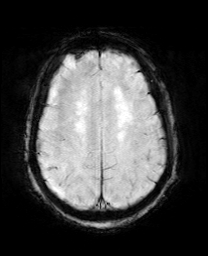
[im 52/52]
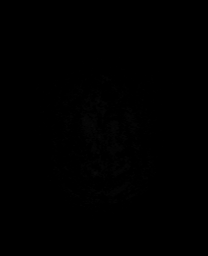

[Series 13: pha_images · axial · 3.0mm · 0.90mm/px · z∈[-96,+54]mm · 4 of 51 slices shown]
[im 1/51]
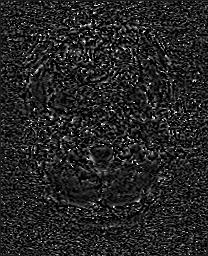
[im 17/51]
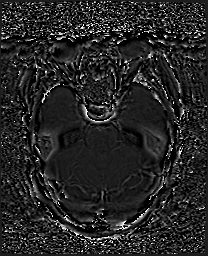
[im 34/51]
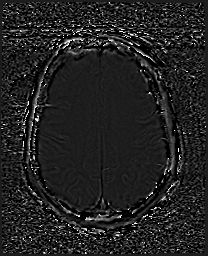
[im 51/51]
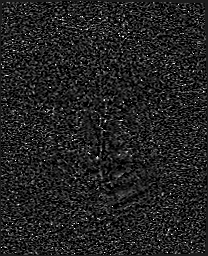

[Series 14: swi_images · axial · 3.0mm · 0.90mm/px · z∈[-99,+54]mm · 4 of 52 slices shown]
[im 1/52]
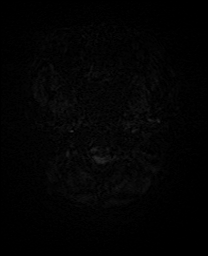
[im 18/52]
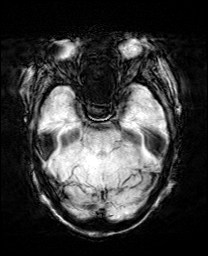
[im 35/52]
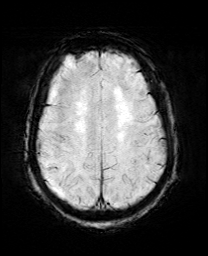
[im 52/52]
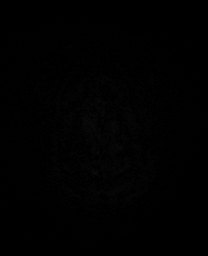

[Series 15: mip_images(sw) · axial · 24.0mm · 0.90mm/px · z∈[-89,+43]mm · 4 of 45 slices shown]
[im 1/45]
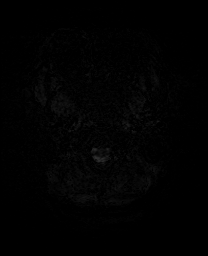
[im 15/45]
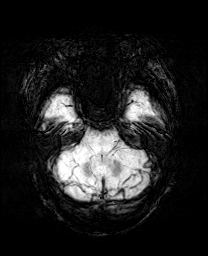
[im 30/45]
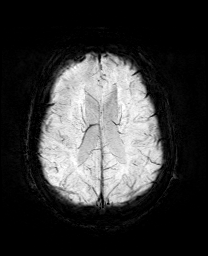
[im 45/45]
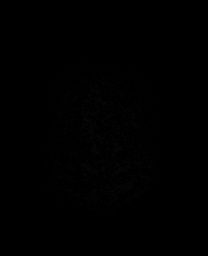

[Series 17: T2 · coronal · 5.0mm · 0.34mm/px · 2 of 29 slices shown (2 of 2)]
[im 1/29]
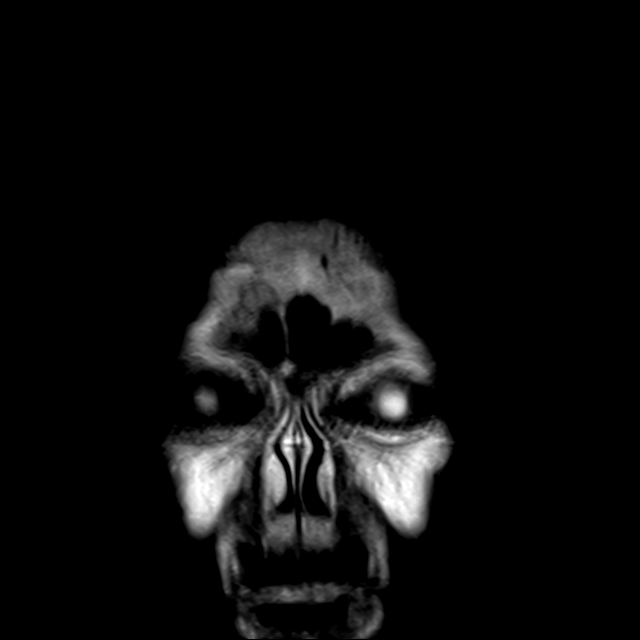
[im 29/29]
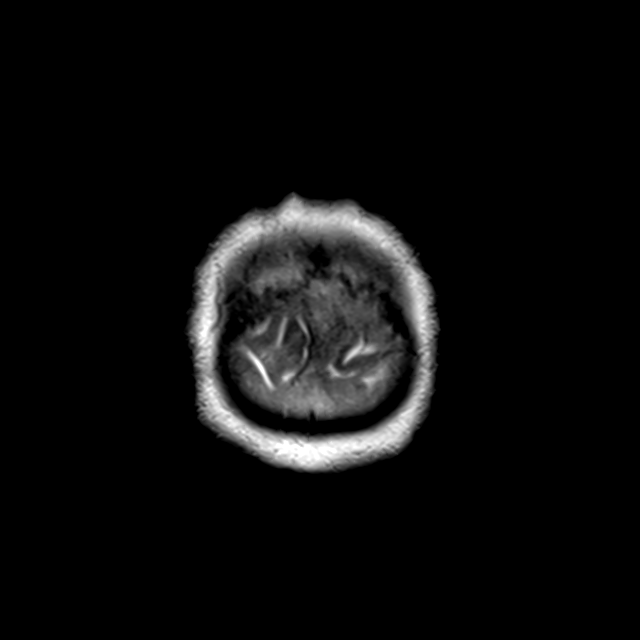

[44 of 48 positions shown; findings below may reference images not displayed]

FINDINGS: MRI HEAD FINDINGS

Brain:

There is moderate motion degradation of the coronal T2 weighted
sequence.

Moderate patchy T2/FLAIR hyperintensity within the cerebral white
matter is nonspecific, but consistent with chronic small vessel
ischemic disease. There are chronic lacunar infarcts within the
bilateral basal ganglia/internal capsules.

Nonspecific chronic microhemorrhages within the splenium of the
corpus callosum and left basal ganglia.

Cerebral volume is normal for age.

There is no acute infarct.

No evidence of intracranial mass.

No chronic intracranial blood products.

No extra-axial fluid collection.

No midline shift.

Vascular: Expected proximal arterial flow voids.

Skull and upper cervical spine: No focal marrow lesion.

Sinuses/Orbits: Bilateral lens replacements. Mild ethmoid sinus
mucosal thickening. No significant mastoid effusion.

MRA HEAD FINDINGS

Examination limited by motion degradation.

Intracranial internal carotid arteries are patent. Moderate
atherosclerotic narrowing of the right cavernous segment.
Mild-to-moderate narrowing of the distal cavernous/paraclinoid
segment on the left.

The M1 middle cerebral arteries are patent without significant
stenosis. Motion limits evaluation of the M2 and more distal MCA
branch vessels. However, there are apparent moderate/severe focal
stenoses within superior and inferior division proximal right M2 MCA
branches (series 5775, image 8) (series 5775, image 3). Apparent
high-grade focal stenosis within a proximal left M2 inferior
division branch (series 2222, image 4).

The A1 right anterior cerebral artery is developmentally absent.
Azygos configuration of the A2 and A3 anterior cerebral arteries. No
significant proximal stenosis.

The intracranial vertebral arteries are patent without significant
stenosis, as is the basilar artery. The posterior cerebral arteries
are patent bilaterally. High-grade stenosis within the right
posterior cerebral artery at the P2-P3 junction (series 5676, image
15). Multiple sites of moderate stenosis within the P2 left
posterior cerebral artery. Multifocal moderate to moderately severe
stenoses within the P3 and more distal left PCA. A left posterior
communicating artery is present. The right posterior communicating
artery is hypoplastic or absent.

No intracranial aneurysm is identified.
IMPRESSION: MRI brain:

1. No evidence of acute intracranial abnormality, including acute
infarction.
2. Chronic lacunar infarcts within the bilateral internal
capsules/basal ganglia. Specifically, the age-indeterminate lacunar
infarct described on earlier head CT is chronic.
3. Background moderate chronic small vessel ischemic changes within
the cerebral white matter.
4. Mild ethmoid sinus mucosal thickening.

MRA head:

1. Examination limited by motion degradation.
2. Intracranial atherosclerotic disease with multifocal stenoses,
most notably as follows.
3. Moderate atherosclerotic narrowing of the cavernous right ICA.
4. Mild/moderate narrowing of the distal cavernous/paraclinoid left
ICA.
5. There are apparent high-grade stenoses within multiple proximal
M2 MCA branch vessels bilaterally. Some of these stenoses may be
accentuated by motion artifact.
6. High-grade stenosis within the right posterior cerebral artery at
the P2-P3 junction.
7. Multifocal moderate stenoses within the P2 left PCA. Multiple
moderate to moderately severe stenoses within the P3 and more distal
left PCA.
